# Patient Record
Sex: Female | Born: 1955 | Race: White | Hispanic: No | Marital: Married | Smoking: Former smoker
Health system: Southern US, Community
[De-identification: ages and names within clinical notes are randomized; demographics above are authoritative.]

## PROBLEM LIST (undated history)

## (undated) DIAGNOSIS — F329 Major depressive disorder, single episode, unspecified: Secondary | ICD-10-CM

## (undated) DIAGNOSIS — E785 Hyperlipidemia, unspecified: Secondary | ICD-10-CM

## (undated) DIAGNOSIS — F32A Depression, unspecified: Secondary | ICD-10-CM

## (undated) HISTORY — DX: Hyperlipidemia, unspecified: E78.5

## (undated) HISTORY — PX: REDUCTION MAMMAPLASTY: SUR839

---

## 2004-05-19 ENCOUNTER — Ambulatory Visit: Payer: Self-pay | Admitting: Psychiatry

## 2004-05-19 ENCOUNTER — Other Ambulatory Visit (HOSPITAL_COMMUNITY): Admission: RE | Admit: 2004-05-19 | Discharge: 2004-06-12 | Payer: Self-pay | Admitting: Psychiatry

## 2012-06-02 ENCOUNTER — Emergency Department (HOSPITAL_BASED_OUTPATIENT_CLINIC_OR_DEPARTMENT_OTHER)
Admission: EM | Admit: 2012-06-02 | Discharge: 2012-06-02 | Disposition: A | Discharge status: Home / Self Care | Payer: 59 | Attending: Emergency Medicine | Admitting: Emergency Medicine

## 2012-06-02 ENCOUNTER — Encounter (HOSPITAL_BASED_OUTPATIENT_CLINIC_OR_DEPARTMENT_OTHER): Payer: Self-pay

## 2012-06-02 DIAGNOSIS — F17200 Nicotine dependence, unspecified, uncomplicated: Secondary | ICD-10-CM | POA: Insufficient documentation

## 2012-06-02 DIAGNOSIS — R51 Headache: Secondary | ICD-10-CM | POA: Insufficient documentation

## 2012-06-02 DIAGNOSIS — F329 Major depressive disorder, single episode, unspecified: Secondary | ICD-10-CM | POA: Insufficient documentation

## 2012-06-02 DIAGNOSIS — Z79899 Other long term (current) drug therapy: Secondary | ICD-10-CM | POA: Insufficient documentation

## 2012-06-02 DIAGNOSIS — F3289 Other specified depressive episodes: Secondary | ICD-10-CM | POA: Insufficient documentation

## 2012-06-02 HISTORY — DX: Major depressive disorder, single episode, unspecified: F32.9

## 2012-06-02 HISTORY — DX: Depression, unspecified: F32.A

## 2012-06-02 MED ORDER — HYDROCODONE-ACETAMINOPHEN 5-325 MG PO TABS: 2.0000 | ORAL_TABLET | ORAL | Status: DC | PRN

## 2012-06-02 NOTE — ED Notes (Signed)
Pt reports left facial pain from temporal area radiating to left jaw.  Denies dental pain.

## 2012-06-02 NOTE — ED Provider Notes (Signed)
History     CSN: [id number]  Arrival date & time 06/02/12  0803   First MD Initiated Contact with Patient 06/02/12 0818      Chief Complaint  Patient presents with  . Facial Pain    (Consider location/radiation/quality/duration/timing/severity/associated sxs/prior treatment) HPI Comments: Patient presents with severe pain in the left side of her face and jaw that has been occurring off and on since October.  She states that this is a constant pain, not a shooting or intermittent pain.  Started again yesterday and not relieved with tylenol or ibuprofen.  No injury or trauma. Denies toothache or fever.  She was seen be dentistry for the same months ago and was told everything was okay.  Not worse with eating or drinking.  The history is provided by the patient.    Past Medical History  Diagnosis Date  . Depression     Past Surgical History  Procedure Laterality Date  . Cesarean section      No family history on file.  History  Substance Use Topics  . Smoking status: Current Every Day Smoker -- 0.50 packs/day    Types: Cigarettes  . Smokeless tobacco: Not on file  . Alcohol Use: Yes     Comment: weekend    OB History   Grav Para Term Preterm Abortions TAB SAB Ect Mult Living                  Review of Systems  All other systems reviewed and are negative.    Allergies  Review of patient's allergies indicates no known allergies.  Home Medications   Current Outpatient Rx  Name  Route  Sig  Dispense  Refill  . escitalopram (LEXAPRO) 10 MG tablet   Oral   Take 10 mg by mouth daily.         Marland Kitchen lamoTRIgine (LAMICTAL) 200 MG tablet   Oral   Take 200 mg by mouth daily.           BP 155/89  Pulse 80  Temp(Src) 97.5 F (36.4 C) (Oral)  Resp 16  SpO2 100%  Physical Exam  Nursing note and vitals reviewed. Constitutional: She is oriented to person, place, and time. She appears well-developed and well-nourished. No distress.  Patient appears anxious,  and is behaving quite dramatically.  HENT:  Head: Normocephalic and atraumatic.  Mouth/Throat: Oropharynx is clear and moist.  Dentition appears well.  No obvious caries or swelling. There is no ttp over the mandible, TMJ or maxillary region.  There is no anterior cervical adenopathy.  There is no crepitus of the TMJ.    Neck: Normal range of motion. Neck supple.  Cardiovascular: Normal rate and regular rhythm.   Pulmonary/Chest: Effort normal.  Lymphadenopathy:    She has no cervical adenopathy.  Neurological: She is alert and oriented to person, place, and time.  Skin: Skin is warm and dry. She is not diaphoretic.    ED Course  Procedures (including critical care time)  Labs Reviewed - No data to display No results found.   No diagnosis found.    MDM  Not sure of the exact etiology of her symptoms.  Does not appear dental, does not sound like trigeminal neuralgia.  I do not see anything emergent and do no believe further testing is warranted at this time.  Will treat with a few vicodin, needs follow up with pcp.        Geoffery Lyons, MD 06/02/12 [phone]

## 2012-06-02 NOTE — ED Notes (Signed)
Pt reports left facial pain that started last night unrelieved after taking Tylenol and Ibuprofen.

## 2020-05-31 DIAGNOSIS — R69 Illness, unspecified: Secondary | ICD-10-CM | POA: Diagnosis not present

## 2020-05-31 DIAGNOSIS — F902 Attention-deficit hyperactivity disorder, combined type: Secondary | ICD-10-CM | POA: Diagnosis not present

## 2020-07-15 DIAGNOSIS — F909 Attention-deficit hyperactivity disorder, unspecified type: Secondary | ICD-10-CM | POA: Diagnosis not present

## 2020-07-15 DIAGNOSIS — R69 Illness, unspecified: Secondary | ICD-10-CM | POA: Diagnosis not present

## 2020-07-15 DIAGNOSIS — F419 Anxiety disorder, unspecified: Secondary | ICD-10-CM | POA: Diagnosis not present

## 2020-07-22 DIAGNOSIS — F419 Anxiety disorder, unspecified: Secondary | ICD-10-CM | POA: Diagnosis not present

## 2020-07-22 DIAGNOSIS — R69 Illness, unspecified: Secondary | ICD-10-CM | POA: Diagnosis not present

## 2020-07-22 DIAGNOSIS — F909 Attention-deficit hyperactivity disorder, unspecified type: Secondary | ICD-10-CM | POA: Diagnosis not present

## 2020-07-29 DIAGNOSIS — F909 Attention-deficit hyperactivity disorder, unspecified type: Secondary | ICD-10-CM | POA: Diagnosis not present

## 2020-07-29 DIAGNOSIS — F419 Anxiety disorder, unspecified: Secondary | ICD-10-CM | POA: Diagnosis not present

## 2020-07-29 DIAGNOSIS — R69 Illness, unspecified: Secondary | ICD-10-CM | POA: Diagnosis not present

## 2020-08-05 DIAGNOSIS — F419 Anxiety disorder, unspecified: Secondary | ICD-10-CM | POA: Diagnosis not present

## 2020-08-05 DIAGNOSIS — F909 Attention-deficit hyperactivity disorder, unspecified type: Secondary | ICD-10-CM | POA: Diagnosis not present

## 2020-08-05 DIAGNOSIS — R69 Illness, unspecified: Secondary | ICD-10-CM | POA: Diagnosis not present

## 2020-08-12 DIAGNOSIS — R69 Illness, unspecified: Secondary | ICD-10-CM | POA: Diagnosis not present

## 2020-08-12 DIAGNOSIS — F419 Anxiety disorder, unspecified: Secondary | ICD-10-CM | POA: Diagnosis not present

## 2020-08-12 DIAGNOSIS — F909 Attention-deficit hyperactivity disorder, unspecified type: Secondary | ICD-10-CM | POA: Diagnosis not present

## 2020-08-19 DIAGNOSIS — R69 Illness, unspecified: Secondary | ICD-10-CM | POA: Diagnosis not present

## 2020-08-19 DIAGNOSIS — F909 Attention-deficit hyperactivity disorder, unspecified type: Secondary | ICD-10-CM | POA: Diagnosis not present

## 2020-08-19 DIAGNOSIS — F419 Anxiety disorder, unspecified: Secondary | ICD-10-CM | POA: Diagnosis not present

## 2020-09-04 DIAGNOSIS — F909 Attention-deficit hyperactivity disorder, unspecified type: Secondary | ICD-10-CM | POA: Diagnosis not present

## 2020-09-04 DIAGNOSIS — F419 Anxiety disorder, unspecified: Secondary | ICD-10-CM | POA: Diagnosis not present

## 2020-09-04 DIAGNOSIS — R69 Illness, unspecified: Secondary | ICD-10-CM | POA: Diagnosis not present

## 2020-09-18 DIAGNOSIS — F909 Attention-deficit hyperactivity disorder, unspecified type: Secondary | ICD-10-CM | POA: Diagnosis not present

## 2020-09-18 DIAGNOSIS — R69 Illness, unspecified: Secondary | ICD-10-CM | POA: Diagnosis not present

## 2020-09-18 DIAGNOSIS — F419 Anxiety disorder, unspecified: Secondary | ICD-10-CM | POA: Diagnosis not present

## 2020-10-10 DIAGNOSIS — F909 Attention-deficit hyperactivity disorder, unspecified type: Secondary | ICD-10-CM | POA: Diagnosis not present

## 2020-10-10 DIAGNOSIS — R69 Illness, unspecified: Secondary | ICD-10-CM | POA: Diagnosis not present

## 2020-10-10 DIAGNOSIS — F419 Anxiety disorder, unspecified: Secondary | ICD-10-CM | POA: Diagnosis not present

## 2020-10-29 DIAGNOSIS — R69 Illness, unspecified: Secondary | ICD-10-CM | POA: Diagnosis not present

## 2020-10-29 DIAGNOSIS — F419 Anxiety disorder, unspecified: Secondary | ICD-10-CM | POA: Diagnosis not present

## 2020-10-29 DIAGNOSIS — F909 Attention-deficit hyperactivity disorder, unspecified type: Secondary | ICD-10-CM | POA: Diagnosis not present

## 2020-11-12 DIAGNOSIS — F419 Anxiety disorder, unspecified: Secondary | ICD-10-CM | POA: Diagnosis not present

## 2020-11-12 DIAGNOSIS — F909 Attention-deficit hyperactivity disorder, unspecified type: Secondary | ICD-10-CM | POA: Diagnosis not present

## 2020-11-12 DIAGNOSIS — R69 Illness, unspecified: Secondary | ICD-10-CM | POA: Diagnosis not present

## 2020-11-26 DIAGNOSIS — F339 Major depressive disorder, recurrent, unspecified: Secondary | ICD-10-CM | POA: Diagnosis not present

## 2020-11-26 DIAGNOSIS — F419 Anxiety disorder, unspecified: Secondary | ICD-10-CM | POA: Diagnosis not present

## 2020-11-26 DIAGNOSIS — F909 Attention-deficit hyperactivity disorder, unspecified type: Secondary | ICD-10-CM | POA: Diagnosis not present

## 2020-11-26 DIAGNOSIS — R69 Illness, unspecified: Secondary | ICD-10-CM | POA: Diagnosis not present

## 2020-11-27 DIAGNOSIS — F3181 Bipolar II disorder: Secondary | ICD-10-CM | POA: Diagnosis not present

## 2020-11-27 DIAGNOSIS — F902 Attention-deficit hyperactivity disorder, combined type: Secondary | ICD-10-CM | POA: Diagnosis not present

## 2020-11-27 DIAGNOSIS — R69 Illness, unspecified: Secondary | ICD-10-CM | POA: Diagnosis not present

## 2020-12-10 DIAGNOSIS — F909 Attention-deficit hyperactivity disorder, unspecified type: Secondary | ICD-10-CM | POA: Diagnosis not present

## 2020-12-10 DIAGNOSIS — F419 Anxiety disorder, unspecified: Secondary | ICD-10-CM | POA: Diagnosis not present

## 2020-12-10 DIAGNOSIS — F339 Major depressive disorder, recurrent, unspecified: Secondary | ICD-10-CM | POA: Diagnosis not present

## 2020-12-10 DIAGNOSIS — R69 Illness, unspecified: Secondary | ICD-10-CM | POA: Diagnosis not present

## 2020-12-24 DIAGNOSIS — F419 Anxiety disorder, unspecified: Secondary | ICD-10-CM | POA: Diagnosis not present

## 2020-12-24 DIAGNOSIS — F339 Major depressive disorder, recurrent, unspecified: Secondary | ICD-10-CM | POA: Diagnosis not present

## 2020-12-24 DIAGNOSIS — R69 Illness, unspecified: Secondary | ICD-10-CM | POA: Diagnosis not present

## 2020-12-24 DIAGNOSIS — F909 Attention-deficit hyperactivity disorder, unspecified type: Secondary | ICD-10-CM | POA: Diagnosis not present

## 2021-01-07 DIAGNOSIS — R69 Illness, unspecified: Secondary | ICD-10-CM | POA: Diagnosis not present

## 2021-01-07 DIAGNOSIS — F909 Attention-deficit hyperactivity disorder, unspecified type: Secondary | ICD-10-CM | POA: Diagnosis not present

## 2021-01-07 DIAGNOSIS — F339 Major depressive disorder, recurrent, unspecified: Secondary | ICD-10-CM | POA: Diagnosis not present

## 2021-01-07 DIAGNOSIS — F419 Anxiety disorder, unspecified: Secondary | ICD-10-CM | POA: Diagnosis not present

## 2021-01-21 DIAGNOSIS — R030 Elevated blood-pressure reading, without diagnosis of hypertension: Secondary | ICD-10-CM | POA: Diagnosis not present

## 2021-01-21 DIAGNOSIS — Z1159 Encounter for screening for other viral diseases: Secondary | ICD-10-CM | POA: Diagnosis not present

## 2021-01-21 DIAGNOSIS — Z136 Encounter for screening for cardiovascular disorders: Secondary | ICD-10-CM | POA: Diagnosis not present

## 2021-01-21 DIAGNOSIS — E2839 Other primary ovarian failure: Secondary | ICD-10-CM | POA: Diagnosis not present

## 2021-01-21 DIAGNOSIS — Z23 Encounter for immunization: Secondary | ICD-10-CM | POA: Diagnosis not present

## 2021-01-21 DIAGNOSIS — Z0000 Encounter for general adult medical examination without abnormal findings: Secondary | ICD-10-CM | POA: Diagnosis not present

## 2021-01-21 DIAGNOSIS — R69 Illness, unspecified: Secondary | ICD-10-CM | POA: Diagnosis not present

## 2021-01-21 DIAGNOSIS — Z124 Encounter for screening for malignant neoplasm of cervix: Secondary | ICD-10-CM | POA: Diagnosis not present

## 2021-01-21 DIAGNOSIS — Z1211 Encounter for screening for malignant neoplasm of colon: Secondary | ICD-10-CM | POA: Diagnosis not present

## 2021-01-21 DIAGNOSIS — Z13220 Encounter for screening for lipoid disorders: Secondary | ICD-10-CM | POA: Diagnosis not present

## 2021-01-28 ENCOUNTER — Other Ambulatory Visit: Payer: Self-pay | Admitting: Family Medicine

## 2021-01-28 DIAGNOSIS — E2839 Other primary ovarian failure: Secondary | ICD-10-CM

## 2021-02-04 DIAGNOSIS — F909 Attention-deficit hyperactivity disorder, unspecified type: Secondary | ICD-10-CM | POA: Diagnosis not present

## 2021-02-04 DIAGNOSIS — F339 Major depressive disorder, recurrent, unspecified: Secondary | ICD-10-CM | POA: Diagnosis not present

## 2021-02-04 DIAGNOSIS — F419 Anxiety disorder, unspecified: Secondary | ICD-10-CM | POA: Diagnosis not present

## 2021-02-04 DIAGNOSIS — R69 Illness, unspecified: Secondary | ICD-10-CM | POA: Diagnosis not present

## 2021-02-11 DIAGNOSIS — F419 Anxiety disorder, unspecified: Secondary | ICD-10-CM | POA: Diagnosis not present

## 2021-02-11 DIAGNOSIS — R69 Illness, unspecified: Secondary | ICD-10-CM | POA: Diagnosis not present

## 2021-02-11 DIAGNOSIS — F909 Attention-deficit hyperactivity disorder, unspecified type: Secondary | ICD-10-CM | POA: Diagnosis not present

## 2021-02-11 DIAGNOSIS — F339 Major depressive disorder, recurrent, unspecified: Secondary | ICD-10-CM | POA: Diagnosis not present

## 2021-02-25 DIAGNOSIS — F339 Major depressive disorder, recurrent, unspecified: Secondary | ICD-10-CM | POA: Diagnosis not present

## 2021-02-25 DIAGNOSIS — R69 Illness, unspecified: Secondary | ICD-10-CM | POA: Diagnosis not present

## 2021-02-25 DIAGNOSIS — F909 Attention-deficit hyperactivity disorder, unspecified type: Secondary | ICD-10-CM | POA: Diagnosis not present

## 2021-02-25 DIAGNOSIS — F419 Anxiety disorder, unspecified: Secondary | ICD-10-CM | POA: Diagnosis not present

## 2021-02-26 ENCOUNTER — Other Ambulatory Visit: Payer: Self-pay | Admitting: Family Medicine

## 2021-02-26 DIAGNOSIS — Z1231 Encounter for screening mammogram for malignant neoplasm of breast: Secondary | ICD-10-CM

## 2021-03-11 DIAGNOSIS — R69 Illness, unspecified: Secondary | ICD-10-CM | POA: Diagnosis not present

## 2021-03-11 DIAGNOSIS — F909 Attention-deficit hyperactivity disorder, unspecified type: Secondary | ICD-10-CM | POA: Diagnosis not present

## 2021-03-11 DIAGNOSIS — F339 Major depressive disorder, recurrent, unspecified: Secondary | ICD-10-CM | POA: Diagnosis not present

## 2021-03-11 DIAGNOSIS — F419 Anxiety disorder, unspecified: Secondary | ICD-10-CM | POA: Diagnosis not present

## 2021-03-25 DIAGNOSIS — F339 Major depressive disorder, recurrent, unspecified: Secondary | ICD-10-CM | POA: Diagnosis not present

## 2021-03-25 DIAGNOSIS — F419 Anxiety disorder, unspecified: Secondary | ICD-10-CM | POA: Diagnosis not present

## 2021-03-25 DIAGNOSIS — R69 Illness, unspecified: Secondary | ICD-10-CM | POA: Diagnosis not present

## 2021-03-25 DIAGNOSIS — F909 Attention-deficit hyperactivity disorder, unspecified type: Secondary | ICD-10-CM | POA: Diagnosis not present

## 2021-04-15 DIAGNOSIS — F909 Attention-deficit hyperactivity disorder, unspecified type: Secondary | ICD-10-CM | POA: Diagnosis not present

## 2021-04-15 DIAGNOSIS — R69 Illness, unspecified: Secondary | ICD-10-CM | POA: Diagnosis not present

## 2021-04-15 DIAGNOSIS — F419 Anxiety disorder, unspecified: Secondary | ICD-10-CM | POA: Diagnosis not present

## 2021-04-15 DIAGNOSIS — F339 Major depressive disorder, recurrent, unspecified: Secondary | ICD-10-CM | POA: Diagnosis not present

## 2021-05-06 DIAGNOSIS — F419 Anxiety disorder, unspecified: Secondary | ICD-10-CM | POA: Diagnosis not present

## 2021-05-06 DIAGNOSIS — F339 Major depressive disorder, recurrent, unspecified: Secondary | ICD-10-CM | POA: Diagnosis not present

## 2021-05-06 DIAGNOSIS — R69 Illness, unspecified: Secondary | ICD-10-CM | POA: Diagnosis not present

## 2021-05-06 DIAGNOSIS — F909 Attention-deficit hyperactivity disorder, unspecified type: Secondary | ICD-10-CM | POA: Diagnosis not present

## 2021-05-09 ENCOUNTER — Encounter (HOSPITAL_BASED_OUTPATIENT_CLINIC_OR_DEPARTMENT_OTHER): Payer: Self-pay

## 2021-05-09 ENCOUNTER — Other Ambulatory Visit: Payer: Self-pay

## 2021-05-09 ENCOUNTER — Emergency Department (HOSPITAL_BASED_OUTPATIENT_CLINIC_OR_DEPARTMENT_OTHER)
Admission: EM | Admit: 2021-05-09 | Discharge: 2021-05-10 | Disposition: A | Discharge status: Home / Self Care | Payer: Medicare HMO | Attending: Emergency Medicine | Admitting: Emergency Medicine

## 2021-05-09 DIAGNOSIS — T6591XA Toxic effect of unspecified substance, accidental (unintentional), initial encounter: Secondary | ICD-10-CM | POA: Diagnosis present

## 2021-05-09 DIAGNOSIS — T304 Corrosion of unspecified body region, unspecified degree: Secondary | ICD-10-CM

## 2021-05-09 DIAGNOSIS — T2602XA Burn of left eyelid and periocular area, initial encounter: Secondary | ICD-10-CM | POA: Diagnosis not present

## 2021-05-09 DIAGNOSIS — Z79899 Other long term (current) drug therapy: Secondary | ICD-10-CM | POA: Diagnosis not present

## 2021-05-09 DIAGNOSIS — E7800 Pure hypercholesterolemia, unspecified: Secondary | ICD-10-CM | POA: Diagnosis not present

## 2021-05-09 DIAGNOSIS — X58XXXA Exposure to other specified factors, initial encounter: Secondary | ICD-10-CM | POA: Diagnosis not present

## 2021-05-09 DIAGNOSIS — S0502XA Injury of conjunctiva and corneal abrasion without foreign body, left eye, initial encounter: Secondary | ICD-10-CM | POA: Diagnosis not present

## 2021-05-09 MED ORDER — ERYTHROMYCIN 5 MG/GM OP OINT
TOPICAL_OINTMENT | Freq: Once | OPHTHALMIC | Status: AC
  Administered 2021-05-09: 1 via OPHTHALMIC
  Filled 2021-05-09: qty 3.5

## 2021-05-09 MED ORDER — OXYCODONE HCL 5 MG PO TABS: 5.0000 mg | ORAL_TABLET | Freq: Four times a day (QID) | ORAL | 0 refills | Status: DC | PRN

## 2021-05-09 MED ORDER — FLUORESCEIN SODIUM 1 MG OP STRP
1.0000 | ORAL_STRIP | Freq: Once | OPHTHALMIC | Status: AC
  Administered 2021-05-09: 1 via OPHTHALMIC
  Filled 2021-05-09: qty 1

## 2021-05-09 MED ORDER — FENTANYL CITRATE PF 50 MCG/ML IJ SOSY
100.0000 ug | PREFILLED_SYRINGE | Freq: Once | INTRAMUSCULAR | Status: AC
  Administered 2021-05-09: 100 ug via INTRAVENOUS
  Filled 2021-05-09: qty 2

## 2021-05-09 MED ORDER — HYDROMORPHONE HCL 1 MG/ML IJ SOLN
1.0000 mg | Freq: Once | INTRAMUSCULAR | Status: AC
  Administered 2021-05-09: 1 mg via INTRAVENOUS
  Filled 2021-05-09: qty 1

## 2021-05-09 MED ORDER — ERYTHROMYCIN 5 MG/GM OP OINT: TOPICAL_OINTMENT | OPHTHALMIC | 1 refills | Status: DC
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
## 2021-05-13 DIAGNOSIS — R69 Illness, unspecified: Secondary | ICD-10-CM | POA: Diagnosis not present

## 2021-05-13 DIAGNOSIS — F909 Attention-deficit hyperactivity disorder, unspecified type: Secondary | ICD-10-CM | POA: Diagnosis not present

## 2021-05-13 DIAGNOSIS — F339 Major depressive disorder, recurrent, unspecified: Secondary | ICD-10-CM | POA: Diagnosis not present

## 2021-05-13 DIAGNOSIS — F419 Anxiety disorder, unspecified: Secondary | ICD-10-CM | POA: Diagnosis not present

## 2021-05-14 DIAGNOSIS — H11432 Conjunctival hyperemia, left eye: Secondary | ICD-10-CM | POA: Diagnosis not present

## 2021-05-14 DIAGNOSIS — H04123 Dry eye syndrome of bilateral lacrimal glands: Secondary | ICD-10-CM | POA: Diagnosis not present

## 2021-05-14 DIAGNOSIS — H353131 Nonexudative age-related macular degeneration, bilateral, early dry stage: Secondary | ICD-10-CM | POA: Diagnosis not present

## 2021-05-14 DIAGNOSIS — H35363 Drusen (degenerative) of macula, bilateral: Secondary | ICD-10-CM | POA: Diagnosis not present

## 2021-05-14 DIAGNOSIS — S0512XA Contusion of eyeball and orbital tissues, left eye, initial encounter: Secondary | ICD-10-CM | POA: Diagnosis not present

## 2021-05-14 DIAGNOSIS — S0502XA Injury of conjunctiva and corneal abrasion without foreign body, left eye, initial encounter: Secondary | ICD-10-CM | POA: Diagnosis not present

## 2021-05-26 DIAGNOSIS — S0512XD Contusion of eyeball and orbital tissues, left eye, subsequent encounter: Secondary | ICD-10-CM | POA: Diagnosis not present

## 2021-05-26 DIAGNOSIS — H35363 Drusen (degenerative) of macula, bilateral: Secondary | ICD-10-CM | POA: Diagnosis not present

## 2021-05-26 DIAGNOSIS — H11432 Conjunctival hyperemia, left eye: Secondary | ICD-10-CM | POA: Diagnosis not present

## 2021-05-26 DIAGNOSIS — H353131 Nonexudative age-related macular degeneration, bilateral, early dry stage: Secondary | ICD-10-CM | POA: Diagnosis not present

## 2021-05-26 DIAGNOSIS — R69 Illness, unspecified: Secondary | ICD-10-CM | POA: Diagnosis not present

## 2021-05-26 DIAGNOSIS — F3181 Bipolar II disorder: Secondary | ICD-10-CM | POA: Diagnosis not present

## 2021-05-26 DIAGNOSIS — H04123 Dry eye syndrome of bilateral lacrimal glands: Secondary | ICD-10-CM | POA: Diagnosis not present

## 2021-05-26 DIAGNOSIS — F902 Attention-deficit hyperactivity disorder, combined type: Secondary | ICD-10-CM | POA: Diagnosis not present

## 2021-05-27 DIAGNOSIS — F339 Major depressive disorder, recurrent, unspecified: Secondary | ICD-10-CM | POA: Diagnosis not present

## 2021-05-27 DIAGNOSIS — F419 Anxiety disorder, unspecified: Secondary | ICD-10-CM | POA: Diagnosis not present

## 2021-05-27 DIAGNOSIS — F909 Attention-deficit hyperactivity disorder, unspecified type: Secondary | ICD-10-CM | POA: Diagnosis not present

## 2021-05-27 DIAGNOSIS — R69 Illness, unspecified: Secondary | ICD-10-CM | POA: Diagnosis not present

## 2021-05-30 DIAGNOSIS — Z85828 Personal history of other malignant neoplasm of skin: Secondary | ICD-10-CM | POA: Diagnosis not present

## 2021-05-30 DIAGNOSIS — D225 Melanocytic nevi of trunk: Secondary | ICD-10-CM | POA: Diagnosis not present

## 2021-05-30 DIAGNOSIS — Z08 Encounter for follow-up examination after completed treatment for malignant neoplasm: Secondary | ICD-10-CM | POA: Diagnosis not present

## 2021-06-03 DIAGNOSIS — F339 Major depressive disorder, recurrent, unspecified: Secondary | ICD-10-CM | POA: Diagnosis not present

## 2021-06-03 DIAGNOSIS — F419 Anxiety disorder, unspecified: Secondary | ICD-10-CM | POA: Diagnosis not present

## 2021-06-03 DIAGNOSIS — R69 Illness, unspecified: Secondary | ICD-10-CM | POA: Diagnosis not present

## 2021-06-03 DIAGNOSIS — F909 Attention-deficit hyperactivity disorder, unspecified type: Secondary | ICD-10-CM | POA: Diagnosis not present

## 2021-06-17 DIAGNOSIS — F909 Attention-deficit hyperactivity disorder, unspecified type: Secondary | ICD-10-CM | POA: Diagnosis not present

## 2021-06-17 DIAGNOSIS — F339 Major depressive disorder, recurrent, unspecified: Secondary | ICD-10-CM | POA: Diagnosis not present

## 2021-06-17 DIAGNOSIS — F419 Anxiety disorder, unspecified: Secondary | ICD-10-CM | POA: Diagnosis not present

## 2021-06-17 DIAGNOSIS — R69 Illness, unspecified: Secondary | ICD-10-CM | POA: Diagnosis not present

## 2021-07-01 DIAGNOSIS — F419 Anxiety disorder, unspecified: Secondary | ICD-10-CM | POA: Diagnosis not present

## 2021-07-01 DIAGNOSIS — R69 Illness, unspecified: Secondary | ICD-10-CM | POA: Diagnosis not present

## 2021-07-01 DIAGNOSIS — F909 Attention-deficit hyperactivity disorder, unspecified type: Secondary | ICD-10-CM | POA: Diagnosis not present

## 2021-07-01 DIAGNOSIS — F339 Major depressive disorder, recurrent, unspecified: Secondary | ICD-10-CM | POA: Diagnosis not present

## 2021-07-15 DIAGNOSIS — F419 Anxiety disorder, unspecified: Secondary | ICD-10-CM | POA: Diagnosis not present

## 2021-07-15 DIAGNOSIS — F339 Major depressive disorder, recurrent, unspecified: Secondary | ICD-10-CM | POA: Diagnosis not present

## 2021-07-15 DIAGNOSIS — F909 Attention-deficit hyperactivity disorder, unspecified type: Secondary | ICD-10-CM | POA: Diagnosis not present

## 2021-07-15 DIAGNOSIS — R69 Illness, unspecified: Secondary | ICD-10-CM | POA: Diagnosis not present

## 2021-07-23 ENCOUNTER — Ambulatory Visit
Admission: RE | Admit: 2021-07-23 | Discharge: 2021-07-23 | Disposition: A | Discharge status: Home / Self Care | Payer: Self-pay | Source: Ambulatory Visit | Attending: Family Medicine | Admitting: Family Medicine

## 2021-07-23 ENCOUNTER — Ambulatory Visit
Admission: RE | Admit: 2021-07-23 | Discharge: 2021-07-23 | Disposition: A | Discharge status: Home / Self Care | Payer: Medicare HMO | Source: Ambulatory Visit | Attending: Family Medicine | Admitting: Family Medicine

## 2021-07-23 DIAGNOSIS — M85851 Other specified disorders of bone density and structure, right thigh: Secondary | ICD-10-CM | POA: Diagnosis not present

## 2021-07-23 DIAGNOSIS — Z1231 Encounter for screening mammogram for malignant neoplasm of breast: Secondary | ICD-10-CM

## 2021-07-23 DIAGNOSIS — M810 Age-related osteoporosis without current pathological fracture: Secondary | ICD-10-CM | POA: Diagnosis not present

## 2021-07-23 DIAGNOSIS — E2839 Other primary ovarian failure: Secondary | ICD-10-CM

## 2021-07-23 DIAGNOSIS — Z780 Asymptomatic menopausal state: Secondary | ICD-10-CM | POA: Diagnosis not present

## 2021-07-24 ENCOUNTER — Other Ambulatory Visit: Payer: Self-pay | Admitting: Family Medicine

## 2021-07-24 DIAGNOSIS — R928 Other abnormal and inconclusive findings on diagnostic imaging of breast: Secondary | ICD-10-CM

## 2021-07-29 DIAGNOSIS — Z961 Presence of intraocular lens: Secondary | ICD-10-CM | POA: Diagnosis not present

## 2021-07-29 DIAGNOSIS — H5202 Hypermetropia, left eye: Secondary | ICD-10-CM | POA: Diagnosis not present

## 2021-07-29 DIAGNOSIS — H524 Presbyopia: Secondary | ICD-10-CM | POA: Diagnosis not present

## 2021-07-29 DIAGNOSIS — H26493 Other secondary cataract, bilateral: Secondary | ICD-10-CM | POA: Diagnosis not present

## 2021-07-29 DIAGNOSIS — H52203 Unspecified astigmatism, bilateral: Secondary | ICD-10-CM | POA: Diagnosis not present

## 2021-08-06 ENCOUNTER — Ambulatory Visit
Admission: RE | Admit: 2021-08-06 | Discharge: 2021-08-06 | Disposition: A | Discharge status: Home / Self Care | Payer: Medicare HMO | Source: Ambulatory Visit | Attending: Family Medicine | Admitting: Family Medicine

## 2021-08-06 ENCOUNTER — Other Ambulatory Visit: Payer: Self-pay | Admitting: Family Medicine

## 2021-08-06 DIAGNOSIS — N6314 Unspecified lump in the right breast, lower inner quadrant: Secondary | ICD-10-CM | POA: Diagnosis not present

## 2021-08-06 DIAGNOSIS — N6310 Unspecified lump in the right breast, unspecified quadrant: Secondary | ICD-10-CM

## 2021-08-06 DIAGNOSIS — N6313 Unspecified lump in the right breast, lower outer quadrant: Secondary | ICD-10-CM | POA: Diagnosis not present

## 2021-08-06 DIAGNOSIS — R928 Other abnormal and inconclusive findings on diagnostic imaging of breast: Secondary | ICD-10-CM

## 2021-08-13 DIAGNOSIS — M810 Age-related osteoporosis without current pathological fracture: Secondary | ICD-10-CM | POA: Diagnosis not present

## 2021-08-20 DIAGNOSIS — R69 Illness, unspecified: Secondary | ICD-10-CM | POA: Diagnosis not present

## 2021-08-20 DIAGNOSIS — F419 Anxiety disorder, unspecified: Secondary | ICD-10-CM | POA: Diagnosis not present

## 2021-08-20 DIAGNOSIS — F339 Major depressive disorder, recurrent, unspecified: Secondary | ICD-10-CM | POA: Diagnosis not present

## 2021-08-20 DIAGNOSIS — F909 Attention-deficit hyperactivity disorder, unspecified type: Secondary | ICD-10-CM | POA: Diagnosis not present

## 2021-09-18 DIAGNOSIS — L0390 Cellulitis, unspecified: Secondary | ICD-10-CM | POA: Diagnosis not present

## 2021-09-18 DIAGNOSIS — W57XXXA Bitten or stung by nonvenomous insect and other nonvenomous arthropods, initial encounter: Secondary | ICD-10-CM | POA: Diagnosis not present

## 2021-10-13 DIAGNOSIS — S1086XD Insect bite of other specified part of neck, subsequent encounter: Secondary | ICD-10-CM | POA: Diagnosis not present

## 2021-10-13 DIAGNOSIS — W57XXXD Bitten or stung by nonvenomous insect and other nonvenomous arthropods, subsequent encounter: Secondary | ICD-10-CM | POA: Diagnosis not present

## 2021-10-23 DIAGNOSIS — M810 Age-related osteoporosis without current pathological fracture: Secondary | ICD-10-CM | POA: Diagnosis not present

## 2021-10-31 ENCOUNTER — Other Ambulatory Visit: Payer: Self-pay | Admitting: Family Medicine

## 2021-10-31 DIAGNOSIS — Z8249 Family history of ischemic heart disease and other diseases of the circulatory system: Secondary | ICD-10-CM

## 2021-10-31 DIAGNOSIS — E7800 Pure hypercholesterolemia, unspecified: Secondary | ICD-10-CM

## 2021-12-04 DIAGNOSIS — Z86010 Personal history of colonic polyps: Secondary | ICD-10-CM | POA: Diagnosis not present

## 2021-12-04 DIAGNOSIS — K635 Polyp of colon: Secondary | ICD-10-CM | POA: Diagnosis not present

## 2021-12-04 DIAGNOSIS — K5730 Diverticulosis of large intestine without perforation or abscess without bleeding: Secondary | ICD-10-CM | POA: Diagnosis not present

## 2021-12-04 DIAGNOSIS — Z09 Encounter for follow-up examination after completed treatment for conditions other than malignant neoplasm: Secondary | ICD-10-CM | POA: Diagnosis not present

## 2021-12-08 DIAGNOSIS — K635 Polyp of colon: Secondary | ICD-10-CM | POA: Diagnosis not present

## 2022-01-15 ENCOUNTER — Ambulatory Visit
Admission: RE | Admit: 2022-01-15 | Discharge: 2022-01-15 | Disposition: A | Discharge status: Home / Self Care | Payer: No Typology Code available for payment source | Source: Ambulatory Visit | Attending: Family Medicine | Admitting: Family Medicine

## 2022-01-15 DIAGNOSIS — Z8249 Family history of ischemic heart disease and other diseases of the circulatory system: Secondary | ICD-10-CM

## 2022-01-15 DIAGNOSIS — I700 Atherosclerosis of aorta: Secondary | ICD-10-CM | POA: Diagnosis not present

## 2022-01-15 DIAGNOSIS — E7800 Pure hypercholesterolemia, unspecified: Secondary | ICD-10-CM

## 2022-01-22 DIAGNOSIS — D225 Melanocytic nevi of trunk: Secondary | ICD-10-CM | POA: Diagnosis not present

## 2022-01-22 DIAGNOSIS — L821 Other seborrheic keratosis: Secondary | ICD-10-CM | POA: Diagnosis not present

## 2022-01-22 DIAGNOSIS — Z08 Encounter for follow-up examination after completed treatment for malignant neoplasm: Secondary | ICD-10-CM | POA: Diagnosis not present

## 2022-01-22 DIAGNOSIS — L814 Other melanin hyperpigmentation: Secondary | ICD-10-CM | POA: Diagnosis not present

## 2022-01-22 DIAGNOSIS — Z85828 Personal history of other malignant neoplasm of skin: Secondary | ICD-10-CM | POA: Diagnosis not present

## 2022-01-26 DIAGNOSIS — R69 Illness, unspecified: Secondary | ICD-10-CM | POA: Diagnosis not present

## 2022-01-26 DIAGNOSIS — Z79899 Other long term (current) drug therapy: Secondary | ICD-10-CM | POA: Diagnosis not present

## 2022-01-26 DIAGNOSIS — F3181 Bipolar II disorder: Secondary | ICD-10-CM | POA: Diagnosis not present

## 2022-01-26 DIAGNOSIS — F902 Attention-deficit hyperactivity disorder, combined type: Secondary | ICD-10-CM | POA: Diagnosis not present

## 2022-02-09 ENCOUNTER — Ambulatory Visit
Admission: RE | Admit: 2022-02-09 | Discharge: 2022-02-09 | Disposition: A | Discharge status: Home / Self Care | Payer: Medicare HMO | Source: Ambulatory Visit | Attending: Family Medicine | Admitting: Family Medicine

## 2022-02-09 ENCOUNTER — Other Ambulatory Visit: Payer: Self-pay | Admitting: Family Medicine

## 2022-02-09 DIAGNOSIS — N6310 Unspecified lump in the right breast, unspecified quadrant: Secondary | ICD-10-CM

## 2022-02-09 DIAGNOSIS — N6314 Unspecified lump in the right breast, lower inner quadrant: Secondary | ICD-10-CM | POA: Diagnosis not present

## 2022-02-09 DIAGNOSIS — N6315 Unspecified lump in the right breast, overlapping quadrants: Secondary | ICD-10-CM | POA: Diagnosis not present

## 2022-02-09 DIAGNOSIS — N6313 Unspecified lump in the right breast, lower outer quadrant: Secondary | ICD-10-CM | POA: Diagnosis not present

## 2022-02-09 DIAGNOSIS — R92321 Mammographic fibroglandular density, right breast: Secondary | ICD-10-CM | POA: Diagnosis not present

## 2022-02-16 ENCOUNTER — Ambulatory Visit: Payer: Medicare HMO | Admitting: Internal Medicine

## 2022-02-16 ENCOUNTER — Encounter: Payer: Self-pay | Admitting: Internal Medicine

## 2022-02-16 VITALS — BP 173/90 | HR 74 | Resp 14 | Ht 64.0 in | Wt 143.2 lb

## 2022-02-16 DIAGNOSIS — I10 Essential (primary) hypertension: Secondary | ICD-10-CM | POA: Insufficient documentation

## 2022-02-16 DIAGNOSIS — E782 Mixed hyperlipidemia: Secondary | ICD-10-CM | POA: Insufficient documentation

## 2022-02-16 DIAGNOSIS — Z8249 Family history of ischemic heart disease and other diseases of the circulatory system: Secondary | ICD-10-CM | POA: Diagnosis not present

## 2022-02-16 MED ORDER — LISINOPRIL 10 MG PO TABS: 10.0000 mg | ORAL_TABLET | Freq: Every day | ORAL | 3 refills | Status: DC

## 2022-02-16 MED ORDER — HYDROCHLOROTHIAZIDE 25 MG PO TABS: 25.0000 mg | ORAL_TABLET | Freq: Every day | ORAL | 3 refills | Status: DC

## 2022-02-16 NOTE — Progress Notes (Signed)
Primary Physician/Referring:  Melissa Huxley, PA  Patient ID: Melissa Carrillo, female    DOB: 05-07-1955, 66 y.o.   MRN: [id number]  Chief Complaint  Patient presents with   Hyperlipidemia   Hypertension   HPI:    Melissa Carrillo  is a 66 y.o. female with past medical history significant for hypertension and hyperlipidemia who is here to establish care with cardiology.  She was noted to have aortic atherosclerosis on imaging however she has not been worked up for this.  Patient has never had a stress test or an echocardiogram in the past.  She is agreeable to obtaining the studies.  Patient denies chest pain, shortness of breath, palpitations, diaphoresis, syncope.  She does have a blood pressure cuff at home and she would prefer to start taking 1 pill while checking her blood pressure and then adding the second.  Past Medical History:  Diagnosis Date   Depression    Hyperlipidemia    Past Surgical History:  Procedure Laterality Date   CESAREAN SECTION     REDUCTION MAMMAPLASTY     Family History  Problem Relation Age of Onset   Stroke Mother    Pancreatitis Mother    Stroke Father    Kidney failure Father    Syncope episode Sister    Heart attack Brother 67   Gout Brother    Seizures Brother    Pancreatic cancer Maternal Aunt    Heart attack Maternal Grandmother    Thyroid cancer Daughter     Social History   Tobacco Use   Smoking status: Former    Packs/day: 0.50    Types: Cigarettes    Quit date: 2016    Years since quitting: 7.9   Smokeless tobacco: Not on file  Substance Use Topics   Alcohol use: Yes    Alcohol/week: 5.0 standard drinks of alcohol    Types: 5 Shots of liquor per week    Comment: about 5 vodka drinks a week   Marital Status: Married  ROS  Review of Systems  Cardiovascular:  Positive for irregular heartbeat. Negative for chest pain, claudication, cyanosis, dyspnea on exertion, near-syncope, orthopnea and palpitations.   Objective  Blood  pressure (!) 173/90, pulse 74, resp. rate 14, height _0  (1.626 m), weight 143 lb 3.2 oz (65 kg), SpO2 96 %. Body mass index is 24.58 kg/m.     02/16/2022    1:34 PM 05/09/2021   11:35 PM 05/09/2021    7:37 PM  Vitals with BMI  Height _1     Weight 143 lbs 3 oz    BMI 46.65    Systolic 993 570 177  Diastolic 90 87 86  Pulse 74 82 88     Physical Exam Vitals reviewed.  HENT:     Head: Normocephalic and atraumatic.  Cardiovascular:     Rate and Rhythm: Normal rate and regular rhythm.     Pulses: Normal pulses.     Heart sounds: Normal heart sounds. No murmur heard. Pulmonary:     Effort: Pulmonary effort is normal.     Breath sounds: Normal breath sounds.  Abdominal:     General: Bowel sounds are normal.  Musculoskeletal:     Right lower leg: No edema.     Left lower leg: No edema.  Skin:    General: Skin is warm and dry.  Neurological:     Mental Status: She is alert.     Medications and allergies  Allergies  Allergen Reactions   Neomycin-Polymyxin-Gramicidin Rash    It is an over the counter item   Nickel Rash     Medication list after today's encounter   Current Outpatient Medications:    amphetamine-dextroamphetamine (ADDERALL XR) 30 MG 24 hr capsule, Take 30 mg by mouth daily., Disp: , Rfl:    amphetamine-dextroamphetamine (ADDERALL) 20 MG tablet, Take 20 mg by mouth daily as needed., Disp: , Rfl:    aspirin EC 81 MG tablet, Take 81 mg by mouth daily. Swallow whole., Disp: , Rfl:    atorvastatin (LIPITOR) 20 MG tablet, Take 20 mg by mouth daily., Disp: , Rfl:    Biotin 1 MG CAPS, Take by mouth., Disp: , Rfl:    clindamycin (CLEOCIN) 150 MG capsule, Take 150 mg by mouth 3 (three) times daily., Disp: , Rfl:    clonazePAM (KLONOPIN) 1 MG tablet, Take 1 mg by mouth 2 (two) times daily as needed., Disp: , Rfl:    denosumab (PROLIA) 60 MG/ML SOSY injection, Inject 60 mg into the skin every 6 (six) months., Disp: , Rfl:    escitalopram (LEXAPRO) 10 MG tablet,  Take 10 mg by mouth daily., Disp: , Rfl:    hydrochlorothiazide (HYDRODIURIL) 25 MG tablet, Take 1 tablet (25 mg total) by mouth daily., Disp: 90 tablet, Rfl: 3   HYDROcodone-acetaminophen (NORCO) 5-325 MG per tablet, Take 2 tablets by mouth every 4 (four) hours as needed for pain., Disp: 10 tablet, Rfl: 0   lamoTRIgine (LAMICTAL) 200 MG tablet, Take 200 mg by mouth daily., Disp: , Rfl:    lisinopril (ZESTRIL) 10 MG tablet, Take 1 tablet (10 mg total) by mouth daily., Disp: 90 tablet, Rfl: 3  Laboratory examination:   No results found for: "NA", "K", "CO2", "GLUCOSE", "BUN", "CREATININE", "CALCIUM", "EGFR", "GFRNONAA"      No data to display             No data to display          Lipid Panel No results for input(s): "CHOL", "TRIG", "LDLCALC", "VLDL", "HDL", "CHOLHDL", "LDLDIRECT" in the last 8760 hours.  HEMOGLOBIN A1C No results found for: "HGBA1C", "MPG" TSH No results for input(s): "TSH" in the last 8760 hours.  External labs:     Radiology:    Cardiac Studies:     EKG:   02/16/2022: Sinus Rhythm with LVH, Nonspecific ST depression secondary to LVH. IVCD   Assessment     ICD-10-CM   1. Family history of heart attack  Z82.49 EKG 12-Lead    PCV ECHOCARDIOGRAM COMPLETE    PCV MYOCARDIAL PERFUSION WO LEXISCAN    2. Essential hypertension  I10 PCV ECHOCARDIOGRAM COMPLETE    PCV MYOCARDIAL PERFUSION WO LEXISCAN    3. Mixed hyperlipidemia  E78.2 PCV ECHOCARDIOGRAM COMPLETE    PCV MYOCARDIAL PERFUSION WO LEXISCAN       Orders Placed This Encounter  Procedures   PCV MYOCARDIAL PERFUSION WO LEXISCAN    Standing Status:   Future    Standing Expiration Date:   04/18/2022   EKG 12-Lead   PCV ECHOCARDIOGRAM COMPLETE    Standing Status:   Future    Standing Expiration Date:   02/17/2023    Meds ordered this encounter  Medications   hydrochlorothiazide (HYDRODIURIL) 25 MG tablet    Sig: Take 1 tablet (25 mg total) by mouth daily.    Dispense:  90 tablet     Refill:  3   lisinopril (ZESTRIL) 10 MG tablet  Sig: Take 1 tablet (10 mg total) by mouth daily.    Dispense:  90 tablet    Refill:  3    Medications Discontinued During This Encounter  Medication Reason   erythromycin ophthalmic ointment    oxyCODONE (ROXICODONE) 5 MG immediate release tablet      Recommendations:   Melissa Carrillo is a 66 y.o.  female with HTN and HLD  Family history of heart attack Echocardiogram and stress test ordered   Essential hypertension Continue current cardiac medications. Adding HCTZ and Lisinopril for better BP control Encourage low-sodium diet, less than 2000 mg daily. Follow-up in 1-2 months or sooner if needed   Mixed hyperlipidemia Continue statin     Floydene Flock, DO, [facility]  02/16/2022, 2:21 PM Office: [phone] Pager: [phone]

## 2022-03-02 ENCOUNTER — Ambulatory Visit: Payer: Medicare HMO

## 2022-03-02 DIAGNOSIS — I10 Essential (primary) hypertension: Secondary | ICD-10-CM

## 2022-03-02 DIAGNOSIS — Z8249 Family history of ischemic heart disease and other diseases of the circulatory system: Secondary | ICD-10-CM | POA: Diagnosis not present

## 2022-03-02 DIAGNOSIS — E782 Mixed hyperlipidemia: Secondary | ICD-10-CM

## 2022-03-30 ENCOUNTER — Ambulatory Visit: Payer: Medicare HMO

## 2022-03-30 DIAGNOSIS — Z0189 Encounter for other specified special examinations: Secondary | ICD-10-CM | POA: Diagnosis not present

## 2022-03-30 DIAGNOSIS — E782 Mixed hyperlipidemia: Secondary | ICD-10-CM

## 2022-03-30 DIAGNOSIS — Z8249 Family history of ischemic heart disease and other diseases of the circulatory system: Secondary | ICD-10-CM

## 2022-03-30 DIAGNOSIS — I10 Essential (primary) hypertension: Secondary | ICD-10-CM

## 2022-03-30 DIAGNOSIS — R9431 Abnormal electrocardiogram [ECG] [EKG]: Secondary | ICD-10-CM | POA: Diagnosis not present

## 2022-03-30 NOTE — Progress Notes (Signed)
Called patient to inform her about her stress test result. Patient understands

## 2022-04-03 ENCOUNTER — Ambulatory Visit: Payer: Medicare HMO | Admitting: Internal Medicine

## 2022-04-03 ENCOUNTER — Encounter: Payer: Self-pay | Admitting: Internal Medicine

## 2022-04-03 VITALS — BP 123/75 | HR 88 | Ht 64.0 in | Wt 146.6 lb

## 2022-04-03 DIAGNOSIS — I10 Essential (primary) hypertension: Secondary | ICD-10-CM | POA: Diagnosis not present

## 2022-04-03 DIAGNOSIS — E782 Mixed hyperlipidemia: Secondary | ICD-10-CM

## 2022-04-03 NOTE — Progress Notes (Signed)
Primary Physician/Referring:  [name], PA  Patient ID: Melissa Carrillo, female    DOB: 06-06-1955, 67 y.o.   MRN: [id number]  Chief Complaint  Patient presents with   Family history of heart attack   Follow-up   Results   HPI:    Melissa Carrillo  is a 67 y.o. female with past medical history significant for hypertension and hyperlipidemia who is here for a follow-up visit. Patient is quite happy that her echo and stress test were both normal. Her blood pressure is now very well controlled and patient is not having any side effects. She has been feeling well since our last visit.  Patient denies chest pain, shortness of breath, palpitations, diaphoresis, syncope.    Past Medical History:  Diagnosis Date   Depression    Hyperlipidemia    Past Surgical History:  Procedure Laterality Date   CESAREAN SECTION     REDUCTION MAMMAPLASTY     Family History  Problem Relation Age of Onset   Stroke Mother    Pancreatitis Mother    Stroke Father    Kidney failure Father    Syncope episode Sister    Heart attack Brother 64   Gout Brother    Seizures Brother    Pancreatic cancer Maternal Aunt    Heart attack Maternal Grandmother    Thyroid cancer Daughter     Social History   Tobacco Use   Smoking status: Former    Packs/day: 0.50    Types: Cigarettes    Quit date: 2016    Years since quitting: 8.0   Smokeless tobacco: Not on file  Substance Use Topics   Alcohol use: Yes    Alcohol/week: 5.0 standard drinks of alcohol    Types: 5 Shots of liquor per week    Comment: about 5 vodka drinks a week   Marital Status: Married  ROS  Review of Systems  Cardiovascular:  Negative for chest pain, claudication, cyanosis, dyspnea on exertion, near-syncope, orthopnea and palpitations.   Objective  Blood pressure 123/75, pulse 88, height '5\' 4"'$  (1.626 m), weight 146 lb 9.6 oz (66.5 kg), SpO2 98 %. Body mass index is 25.16 kg/m.     04/03/2022   11:38 AM 02/16/2022    1:34 PM  05/09/2021   11:35 PM  Vitals with BMI  Height '5\' 4"'$  '5\' 4"'$    Weight 146 lbs 10 oz 143 lbs 3 oz   BMI 76.72 09.47   Systolic 096 283 662  Diastolic 75 90 87  Pulse 88 74 82     Physical Exam Vitals reviewed.  HENT:     Head: Normocephalic and atraumatic.  Cardiovascular:     Rate and Rhythm: Normal rate and regular rhythm.     Pulses: Normal pulses.     Heart sounds: Normal heart sounds. No murmur heard. Pulmonary:     Effort: Pulmonary effort is normal.     Breath sounds: Normal breath sounds.  Abdominal:     General: Bowel sounds are normal.  Musculoskeletal:     Right lower leg: No edema.     Left lower leg: No edema.  Skin:    General: Skin is warm and dry.  Neurological:     Mental Status: She is alert.     Medications and allergies   Allergies  Allergen Reactions   Neomycin-Polymyxin-Gramicidin Rash    It is an over the counter item   Nickel Rash     Medication list after  today's encounter   Current Outpatient Medications:    amphetamine-dextroamphetamine (ADDERALL XR) 30 MG 24 hr capsule, Take 30 mg by mouth daily., Disp: , Rfl:    amphetamine-dextroamphetamine (ADDERALL) 20 MG tablet, Take 20 mg by mouth daily as needed., Disp: , Rfl:    aspirin EC 81 MG tablet, Take 81 mg by mouth daily. Swallow whole., Disp: , Rfl:    atorvastatin (LIPITOR) 20 MG tablet, Take 20 mg by mouth daily., Disp: , Rfl:    Biotin 1 MG CAPS, Take by mouth., Disp: , Rfl:    Cholecalciferol (D-3-5) 125 MCG (5000 UT) capsule, Take 5,000 Units by mouth daily., Disp: , Rfl:    clonazePAM (KLONOPIN) 1 MG tablet, Take 1 mg by mouth 2 (two) times daily as needed., Disp: , Rfl:    denosumab (PROLIA) 60 MG/ML SOSY injection, Inject 60 mg into the skin every 6 (six) months., Disp: , Rfl:    escitalopram (LEXAPRO) 10 MG tablet, Take 10 mg by mouth daily., Disp: , Rfl:    hydrochlorothiazide (HYDRODIURIL) 25 MG tablet, Take 1 tablet (25 mg total) by mouth daily., Disp: 90 tablet, Rfl: 3    HYDROcodone-acetaminophen (NORCO) 10-325 MG tablet, Take 1 tablet by mouth 3 (three) times daily as needed., Disp: , Rfl:    lamoTRIgine (LAMICTAL) 200 MG tablet, Take 200 mg by mouth daily., Disp: , Rfl:    lisinopril (ZESTRIL) 10 MG tablet, Take 1 tablet (10 mg total) by mouth daily., Disp: 90 tablet, Rfl: 3   multivitamin-lutein (OCUVITE-LUTEIN) CAPS capsule, Take 1 capsule by mouth daily., Disp: , Rfl:   Laboratory examination:   No results found for: "NA", "K", "CO2", "GLUCOSE", "BUN", "CREATININE", "CALCIUM", "EGFR", "GFRNONAA"      No data to display             No data to display          Lipid Panel No results for input(s): "CHOL", "TRIG", "LDLCALC", "VLDL", "HDL", "CHOLHDL", "LDLDIRECT" in the last 8760 hours.  HEMOGLOBIN A1C No results found for: "HGBA1C", "MPG" TSH No results for input(s): "TSH" in the last 8760 hours.  External labs:     Radiology:    Cardiac Studies:   Exercise nuclear stress test 03/30/2022 Myocardial perfusion is normal. Overall LV systolic function is normal without regional wall motion abnormalities. Stress LV EF: 60%. Low risk study. Normal ECG stress. The patient exercised for 5 minutes and 16 seconds of a Bruce protocol, achieving approximately 7.05 METs and reached 91% MPHR. The heart rate response was normal. The blood pressure response was normal. No previous exam available for comparison.   Echocardiogram 03/02/2022: Left ventricle cavity is normal in size and wall thickness. Normal global wall motion. Normal LV systolic function with EF 65%. Doppler evidence of grade I (impaired) diastolic dysfunction, normal LAP. No significant valvular abnormality. Normal right atrial pressure.    EKG:   02/16/2022: Sinus Rhythm with LVH, Nonspecific ST depression secondary to LVH. IVCD   Assessment     ICD-10-CM   1. Mixed hyperlipidemia  E78.2     2. Essential hypertension  I10        No orders of the defined types were  placed in this encounter.   No orders of the defined types were placed in this encounter.   Medications Discontinued During This Encounter  Medication Reason   clindamycin (CLEOCIN) 150 MG capsule Completed Course   HYDROcodone-acetaminophen ([place]) 5-325 MG per tablet Completed Course     Recommendations:  NATESHA HASSEY is a 67 y.o.  female with HTN and HLD  Family history of heart attack Echocardiogram and stress test negative for ischemia   Essential hypertension Continue current cardiac medications. BP very well controlled on lisinopril. She checks her BP regularly and takes the HCTZ as needed for elevated BP but she has not had to take it at all since starting on lisinopril. Encourage low-sodium diet, less than 2000 mg daily. Follow-up in 6 months or sooner if needed   Mixed hyperlipidemia Continue statin Primary following lipids     Melissa Flock, DO, [facility]  04/03/2022, 12:26 PM Office: [phone] Pager: [phone]

## 2022-04-14 DIAGNOSIS — M810 Age-related osteoporosis without current pathological fracture: Secondary | ICD-10-CM | POA: Diagnosis not present

## 2022-04-14 DIAGNOSIS — E7800 Pure hypercholesterolemia, unspecified: Secondary | ICD-10-CM | POA: Diagnosis not present

## 2022-04-14 DIAGNOSIS — E559 Vitamin D deficiency, unspecified: Secondary | ICD-10-CM | POA: Diagnosis not present

## 2022-04-14 DIAGNOSIS — Z0000 Encounter for general adult medical examination without abnormal findings: Secondary | ICD-10-CM | POA: Diagnosis not present

## 2022-04-14 DIAGNOSIS — I700 Atherosclerosis of aorta: Secondary | ICD-10-CM | POA: Diagnosis not present

## 2022-04-14 DIAGNOSIS — I10 Essential (primary) hypertension: Secondary | ICD-10-CM | POA: Diagnosis not present

## 2022-04-14 DIAGNOSIS — R69 Illness, unspecified: Secondary | ICD-10-CM | POA: Diagnosis not present

## 2022-05-07 DIAGNOSIS — M810 Age-related osteoporosis without current pathological fracture: Secondary | ICD-10-CM | POA: Diagnosis not present

## 2022-05-19 ENCOUNTER — Other Ambulatory Visit: Payer: Self-pay

## 2022-05-19 MED ORDER — LISINOPRIL 10 MG PO TABS: 10.0000 mg | ORAL_TABLET | Freq: Every day | ORAL | 3 refills | Status: AC

## 2022-06-06 DIAGNOSIS — R69 Illness, unspecified: Secondary | ICD-10-CM | POA: Diagnosis not present

## 2022-07-27 DIAGNOSIS — F902 Attention-deficit hyperactivity disorder, combined type: Secondary | ICD-10-CM | POA: Diagnosis not present

## 2022-07-27 DIAGNOSIS — F3181 Bipolar II disorder: Secondary | ICD-10-CM | POA: Diagnosis not present

## 2022-07-29 DIAGNOSIS — F909 Attention-deficit hyperactivity disorder, unspecified type: Secondary | ICD-10-CM | POA: Diagnosis not present

## 2022-07-29 DIAGNOSIS — F339 Major depressive disorder, recurrent, unspecified: Secondary | ICD-10-CM | POA: Diagnosis not present

## 2022-07-29 DIAGNOSIS — F419 Anxiety disorder, unspecified: Secondary | ICD-10-CM | POA: Diagnosis not present

## 2022-08-03 DIAGNOSIS — F419 Anxiety disorder, unspecified: Secondary | ICD-10-CM | POA: Diagnosis not present

## 2022-08-03 DIAGNOSIS — F339 Major depressive disorder, recurrent, unspecified: Secondary | ICD-10-CM | POA: Diagnosis not present

## 2022-08-03 DIAGNOSIS — F909 Attention-deficit hyperactivity disorder, unspecified type: Secondary | ICD-10-CM | POA: Diagnosis not present

## 2022-08-05 ENCOUNTER — Ambulatory Visit
Admission: RE | Admit: 2022-08-05 | Discharge: 2022-08-05 | Disposition: A | Discharge status: Home / Self Care | Payer: Medicare HMO | Source: Ambulatory Visit | Attending: Family Medicine | Admitting: Family Medicine

## 2022-08-05 DIAGNOSIS — N6310 Unspecified lump in the right breast, unspecified quadrant: Secondary | ICD-10-CM

## 2022-08-05 DIAGNOSIS — N63 Unspecified lump in unspecified breast: Secondary | ICD-10-CM | POA: Diagnosis not present

## 2022-08-11 DIAGNOSIS — F419 Anxiety disorder, unspecified: Secondary | ICD-10-CM | POA: Diagnosis not present

## 2022-08-11 DIAGNOSIS — F909 Attention-deficit hyperactivity disorder, unspecified type: Secondary | ICD-10-CM | POA: Diagnosis not present

## 2022-08-11 DIAGNOSIS — F339 Major depressive disorder, recurrent, unspecified: Secondary | ICD-10-CM | POA: Diagnosis not present

## 2022-08-25 DIAGNOSIS — F419 Anxiety disorder, unspecified: Secondary | ICD-10-CM | POA: Diagnosis not present

## 2022-08-25 DIAGNOSIS — F909 Attention-deficit hyperactivity disorder, unspecified type: Secondary | ICD-10-CM | POA: Diagnosis not present

## 2022-08-25 DIAGNOSIS — F339 Major depressive disorder, recurrent, unspecified: Secondary | ICD-10-CM | POA: Diagnosis not present

## 2022-09-04 DIAGNOSIS — R69 Illness, unspecified: Secondary | ICD-10-CM | POA: Diagnosis not present

## 2022-09-08 DIAGNOSIS — H26493 Other secondary cataract, bilateral: Secondary | ICD-10-CM | POA: Diagnosis not present

## 2022-09-08 DIAGNOSIS — F339 Major depressive disorder, recurrent, unspecified: Secondary | ICD-10-CM | POA: Diagnosis not present

## 2022-09-08 DIAGNOSIS — F419 Anxiety disorder, unspecified: Secondary | ICD-10-CM | POA: Diagnosis not present

## 2022-09-08 DIAGNOSIS — F909 Attention-deficit hyperactivity disorder, unspecified type: Secondary | ICD-10-CM | POA: Diagnosis not present

## 2022-09-08 DIAGNOSIS — H524 Presbyopia: Secondary | ICD-10-CM | POA: Diagnosis not present

## 2022-09-08 DIAGNOSIS — Z961 Presence of intraocular lens: Secondary | ICD-10-CM | POA: Diagnosis not present

## 2022-09-08 DIAGNOSIS — H18413 Arcus senilis, bilateral: Secondary | ICD-10-CM | POA: Diagnosis not present

## 2022-09-08 DIAGNOSIS — H52203 Unspecified astigmatism, bilateral: Secondary | ICD-10-CM | POA: Diagnosis not present

## 2022-09-21 DIAGNOSIS — F909 Attention-deficit hyperactivity disorder, unspecified type: Secondary | ICD-10-CM | POA: Diagnosis not present

## 2022-09-21 DIAGNOSIS — F339 Major depressive disorder, recurrent, unspecified: Secondary | ICD-10-CM | POA: Diagnosis not present

## 2022-09-21 DIAGNOSIS — F419 Anxiety disorder, unspecified: Secondary | ICD-10-CM | POA: Diagnosis not present

## 2022-09-25 ENCOUNTER — Encounter: Payer: Self-pay | Admitting: Cardiology

## 2022-09-25 ENCOUNTER — Ambulatory Visit: Payer: Medicare HMO | Admitting: Cardiology

## 2022-09-25 VITALS — BP 136/77 | HR 88 | Ht 64.0 in | Wt 141.0 lb

## 2022-09-25 DIAGNOSIS — I10 Essential (primary) hypertension: Secondary | ICD-10-CM

## 2022-09-25 DIAGNOSIS — I700 Atherosclerosis of aorta: Secondary | ICD-10-CM

## 2022-09-25 DIAGNOSIS — E782 Mixed hyperlipidemia: Secondary | ICD-10-CM

## 2022-09-25 DIAGNOSIS — R931 Abnormal findings on diagnostic imaging of heart and coronary circulation: Secondary | ICD-10-CM | POA: Diagnosis not present

## 2022-09-25 NOTE — Progress Notes (Signed)
ID:  LYNDZEE SEIGAL, DOB 1956-02-28, MRN [id number]  PCP:  Wilfrid Lund, PA  Cardiologist:  Tessa Lerner, DO, [facility] (established care 09/25/22) Former Cardiology Providers: Dr. Clotilde Dieter  Chief Complaint  Patient presents with   Follow-up    Coronary calcification, secondary prevention    HPI  Melissa Carrillo is a 67 y.o. Caucasian female whose past medical history and cardiovascular risk factors include: Hypertension, hyperlipidemia, minimal coronary artery calcification (3.9, 53rd percentile), aortic atherosclerosis, ADHD, PTSD.  Patient was referred to the practice for evaluation of coronary artery disease given her family history and risk factors.  She had a coronary calcium score in October 2023 which notes minimal CAC as well has aortic atherosclerosis.  She was under the care of my partner Dr. Rozell Searing Custovic and I am seeing her for the first time.  Prior to establishing care with myself she is already undergone echo and stress test results reviewed as part of medical decision making today.  She presents today for follow-up.  Denies anginal chest pain or heart failure symptoms.  FUNCTIONAL STATUS: Enjoy working outside but no structured exercise program or daily routine.   CARDIAC DATABASE: EKG: September 25, 2022: Sinus rhythm, 74 bpm, normal axis, subtle ST depressions in lateral leads consider ischemia.  No significant change compared to prior tracing 02/16/2022.  Echocardiogram: 03/02/2022: Left ventricle cavity is normal in size and wall thickness. Normal global wall motion. Normal LV systolic function with EF 65%. Doppler evidence of grade I (impaired) diastolic dysfunction, normal LAP. No significant valvular abnormality. Normal right atrial pressure.   Stress Testing: Exercise nuclear stress test 03/30/2022 Myocardial perfusion is normal. Overall LV systolic function is normal without regional wall motion abnormalities. Stress LV EF: 60%. Low risk study. Normal  ECG stress. The patient exercised for 5 minutes and 16 seconds of a Bruce protocol, achieving approximately 7.05 METs and reached 91% MPHR. The heart rate response was normal. The blood pressure response was normal. No previous exam available for comparison.  Coronary calcification scoring: October 2023: 1. Coronary artery calcium score of 3.94, which places the patient in the 53th percentile for subjects of the same age, gender and race/ethnicity who are free of clinical cardiovascular disease and treated diabetes. 2. Aortic atherosclerosis.   ALLERGIES: Allergies  Allergen Reactions   Doxycycline     Other Reaction(s): GI Intolerance   Neomycin-Polymyxin-Gramicidin Rash    It is an over the counter item   Nickel Rash   Other Rash    It is an over the counter item    MEDICATION LIST PRIOR TO VISIT: Current Meds  Medication Sig   amphetamine-dextroamphetamine (ADDERALL XR) 30 MG 24 hr capsule Take 30 mg by mouth daily.   amphetamine-dextroamphetamine (ADDERALL) 20 MG tablet Take 20 mg by mouth daily as needed.   aspirin EC 81 MG tablet Take 81 mg by mouth daily. Swallow whole.   atorvastatin (LIPITOR) 40 MG tablet Take 40 mg by mouth daily.   Biotin 1 MG CAPS Take by mouth.   clonazePAM (KLONOPIN) 1 MG tablet Take 1 mg by mouth 2 (two) times daily as needed.   denosumab (PROLIA) 60 MG/ML SOSY injection Inject 60 mg into the skin every 6 (six) months.   escitalopram (LEXAPRO) 20 MG tablet Take 20 mg by mouth daily.   lamoTRIgine (LAMICTAL) 200 MG tablet Take 200 mg by mouth daily.   lisinopril (ZESTRIL) 10 MG tablet Take 1 tablet (10 mg total) by mouth daily.  multivitamin-lutein (OCUVITE-LUTEIN) CAPS capsule Take 1 capsule by mouth daily.   [DISCONTINUED] atorvastatin (LIPITOR) 20 MG tablet Take 20 mg by mouth daily.   [DISCONTINUED] escitalopram (LEXAPRO) 10 MG tablet Take 10 mg by mouth daily.     PAST MEDICAL HISTORY: Past Medical History:  Diagnosis Date   Depression     Hyperlipidemia     PAST SURGICAL HISTORY: Past Surgical History:  Procedure Laterality Date   CESAREAN SECTION     REDUCTION MAMMAPLASTY      FAMILY HISTORY: The patient family history includes Gout in her brother; Heart attack in her maternal grandmother; Heart attack (age of onset: 100) in her brother; Kidney failure in her father; Pancreatic cancer in her maternal aunt; Pancreatitis in her mother; Seizures in her brother; Stroke in her father and mother; Syncope episode in her sister; Thyroid cancer in her daughter.  SOCIAL HISTORY:  The patient  reports that she quit smoking about 8 years ago. Her smoking use included cigarettes. She smoked an average of .5 packs per day. She does not have any smokeless tobacco history on file. She reports current alcohol use of about 5.0 standard drinks of alcohol per week. She reports that she does not use drugs.  REVIEW OF SYSTEMS: Review of Systems  Cardiovascular:  Negative for chest pain, claudication, dyspnea on exertion, irregular heartbeat, leg swelling, near-syncope, orthopnea, palpitations, paroxysmal nocturnal dyspnea and syncope.  Respiratory:  Negative for shortness of breath.   Hematologic/Lymphatic: Negative for bleeding problem.  Musculoskeletal:  Negative for muscle cramps and myalgias.  Neurological:  Negative for dizziness and light-headedness.    PHYSICAL EXAM:    09/25/2022   12:57 PM 04/03/2022   11:38 AM 02/16/2022    1:34 PM  Vitals with BMI  Height 5\' 4"  5\' 4"  5\' 4"   Weight 141 lbs 146 lbs 10 oz 143 lbs 3 oz  BMI 24.19 25.15 24.57  Systolic 136 123 161  Diastolic 77 75 90  Pulse 88 88 74    Physical Exam  Constitutional: No distress.  Age appropriate, hemodynamically stable.   Neck: No JVD present.  Cardiovascular: Normal rate, regular rhythm, S1 normal, S2 normal, intact distal pulses and normal pulses. Exam reveals no gallop, no S3 and no S4.  No murmur heard. Pulmonary/Chest: Effort normal and breath  sounds normal. No stridor. She has no wheezes. She has no rales.  Abdominal: Soft. Bowel sounds are normal. She exhibits no distension. There is no abdominal tenderness.  Musculoskeletal:        General: No edema.     Cervical back: Neck supple.  Neurological: She is alert and oriented to person, place, and time. She has intact cranial nerves (2-12).  Skin: Skin is warm and moist.   LABORATORY DATA: Lipid Panel w/reflex Reviewed date:04/16/2022 08:12:27 AM Interpretation:LDL 121* Performing Lab: Notes/Report: Testing Performed at: [facility], [address], [place], [place] [address]  Cholesterol 194 <200 mg/dL    CHOL/HDL 3.4 5.4-0.9 Ratio    HDLD 57 30-85 mg/dL Values below 40 mg/dL indicate increased risk factor  Triglyceride 86 0-199 mg/dL    NHDL 811 9-147 mg/dL Range dependent upon risk factors.  LDL Chol Calc (NIH) 121 0-99 mg/dL      IMPRESSION:    [id number]   1. Agatston coronary artery calcium score less than 100  R93.1     2. Atherosclerosis of aorta (HCC)  I70.0     3. Benign hypertension  I10     4. Mixed  hyperlipidemia  E78.2 EKG 12-Lead       RECOMMENDATIONS: Melissa Carrillo is a 67 y.o. Caucasian female whose past medical history and cardiac risk factors include: Hypertension, hyperlipidemia, minimal coronary artery calcification (3.9, 53rd percentile), aortic atherosclerosis, ADHD, PTSD.  Agatston coronary artery calcium score less than 100 Atherosclerosis of aorta (HCC) Denies anginal chest pain or heart failure symptoms. EKG similar to prior tracings. Reviewed the echo and the stress test that were recently performed as part of medical decision making and updated above. Most recent labs from January 2024 independently reviewed. Her estimated 10-year risk of ASCVD is approximately 10.1%. After her labs in January 2024 patient states that her lipitor dose was increased to 40 mg p.o. nightly by her PCP.  Follow-up labs are still pending.  No  additional cardiovascular testing warranted at this time. Reemphasized the importance of secondary prevention with focus on improving her modifiable cardiovascular risk factors such as glycemic control, lipid management, blood pressure control, weight loss.  Benign hypertension Office blood pressures are well-controlled. Medications reconciled No changes warranted at this time  Mixed hyperlipidemia Currently on atorvastatin.   She denies myalgia or other side effects. Most recent lipids dated January 2024 reviewed as noted above. Given her most recent LDL levels, estimated 10-year risk of ASCVD currently calculated at 10.1%, agree with uptitration of Lipitor to 40 mg p.o. nightly.  I have advised the patient to have her lipids rechecked to make sure that the medications have made an impact on her lipids.  Recommend a goal LDL of 70 mg/dL if possible. Currently managed by primary care provider.    FINAL MEDICATION LIST END OF ENCOUNTER: No orders of the defined types were placed in this encounter.   Medications Discontinued During This Encounter  Medication Reason   atorvastatin (LIPITOR) 20 MG tablet Dose change   Cholecalciferol (D-3-5) 125 MCG (5000 UT) capsule    escitalopram (LEXAPRO) 10 MG tablet Dose change   hydrochlorothiazide (HYDRODIURIL) 25 MG tablet    HYDROcodone-acetaminophen (NORCO) 10-325 MG tablet      Current Outpatient Medications:    amphetamine-dextroamphetamine (ADDERALL XR) 30 MG 24 hr capsule, Take 30 mg by mouth daily., Disp: , Rfl:    amphetamine-dextroamphetamine (ADDERALL) 20 MG tablet, Take 20 mg by mouth daily as needed., Disp: , Rfl:    aspirin EC 81 MG tablet, Take 81 mg by mouth daily. Swallow whole., Disp: , Rfl:    atorvastatin (LIPITOR) 40 MG tablet, Take 40 mg by mouth daily., Disp: , Rfl:    Biotin 1 MG CAPS, Take by mouth., Disp: , Rfl:    clonazePAM (KLONOPIN) 1 MG tablet, Take 1 mg by mouth 2 (two) times daily as needed., Disp: , Rfl:     denosumab (PROLIA) 60 MG/ML SOSY injection, Inject 60 mg into the skin every 6 (six) months., Disp: , Rfl:    escitalopram (LEXAPRO) 20 MG tablet, Take 20 mg by mouth daily., Disp: , Rfl:    lamoTRIgine (LAMICTAL) 200 MG tablet, Take 200 mg by mouth daily., Disp: , Rfl:    lisinopril (ZESTRIL) 10 MG tablet, Take 1 tablet (10 mg total) by mouth daily., Disp: 90 tablet, Rfl: 3   multivitamin-lutein (OCUVITE-LUTEIN) CAPS capsule, Take 1 capsule by mouth daily., Disp: , Rfl:   Orders Placed This Encounter  Procedures   EKG 12-Lead    There are no Patient Instructions on file for this visit.   --Continue cardiac medications as reconciled in final medication list. --No follow-ups on  file. or sooner if needed. --Continue follow-up with your primary care physician regarding the management of your other chronic comorbid conditions.  Patient's questions and concerns were addressed to her satisfaction. She voices understanding of the instructions provided during this encounter.   This note was created using a voice recognition software as a result there may be grammatical errors inadvertently enclosed that do not reflect the nature of this encounter. Every attempt is made to correct such errors.  Tessa Lerner, [place], [facility]  Pager:  [phone] Office: [phone]

## 2022-10-02 ENCOUNTER — Ambulatory Visit: Payer: Medicare HMO | Admitting: Cardiology

## 2022-10-08 DIAGNOSIS — F909 Attention-deficit hyperactivity disorder, unspecified type: Secondary | ICD-10-CM | POA: Diagnosis not present

## 2022-10-08 DIAGNOSIS — F339 Major depressive disorder, recurrent, unspecified: Secondary | ICD-10-CM | POA: Diagnosis not present

## 2022-10-08 DIAGNOSIS — F419 Anxiety disorder, unspecified: Secondary | ICD-10-CM | POA: Diagnosis not present

## 2022-10-14 DIAGNOSIS — R69 Illness, unspecified: Secondary | ICD-10-CM | POA: Diagnosis not present

## 2022-10-20 DIAGNOSIS — F339 Major depressive disorder, recurrent, unspecified: Secondary | ICD-10-CM | POA: Diagnosis not present

## 2022-10-20 DIAGNOSIS — F419 Anxiety disorder, unspecified: Secondary | ICD-10-CM | POA: Diagnosis not present

## 2022-10-20 DIAGNOSIS — F909 Attention-deficit hyperactivity disorder, unspecified type: Secondary | ICD-10-CM | POA: Diagnosis not present

## 2022-11-01 DIAGNOSIS — R69 Illness, unspecified: Secondary | ICD-10-CM | POA: Diagnosis not present

## 2022-11-03 DIAGNOSIS — F419 Anxiety disorder, unspecified: Secondary | ICD-10-CM | POA: Diagnosis not present

## 2022-11-03 DIAGNOSIS — F339 Major depressive disorder, recurrent, unspecified: Secondary | ICD-10-CM | POA: Diagnosis not present

## 2022-11-03 DIAGNOSIS — F909 Attention-deficit hyperactivity disorder, unspecified type: Secondary | ICD-10-CM | POA: Diagnosis not present

## 2022-11-13 DIAGNOSIS — I700 Atherosclerosis of aorta: Secondary | ICD-10-CM | POA: Diagnosis not present

## 2022-11-13 DIAGNOSIS — F321 Major depressive disorder, single episode, moderate: Secondary | ICD-10-CM | POA: Diagnosis not present

## 2022-11-13 DIAGNOSIS — M810 Age-related osteoporosis without current pathological fracture: Secondary | ICD-10-CM | POA: Diagnosis not present

## 2022-11-13 DIAGNOSIS — F902 Attention-deficit hyperactivity disorder, combined type: Secondary | ICD-10-CM | POA: Diagnosis not present

## 2022-11-13 DIAGNOSIS — E559 Vitamin D deficiency, unspecified: Secondary | ICD-10-CM | POA: Diagnosis not present

## 2022-11-13 DIAGNOSIS — E7800 Pure hypercholesterolemia, unspecified: Secondary | ICD-10-CM | POA: Diagnosis not present

## 2022-11-13 DIAGNOSIS — G4719 Other hypersomnia: Secondary | ICD-10-CM | POA: Diagnosis not present

## 2022-11-13 DIAGNOSIS — F419 Anxiety disorder, unspecified: Secondary | ICD-10-CM | POA: Diagnosis not present

## 2022-11-13 DIAGNOSIS — F909 Attention-deficit hyperactivity disorder, unspecified type: Secondary | ICD-10-CM | POA: Diagnosis not present

## 2022-11-13 DIAGNOSIS — I10 Essential (primary) hypertension: Secondary | ICD-10-CM | POA: Diagnosis not present

## 2022-11-13 DIAGNOSIS — F339 Major depressive disorder, recurrent, unspecified: Secondary | ICD-10-CM | POA: Diagnosis not present

## 2022-11-13 DIAGNOSIS — R0982 Postnasal drip: Secondary | ICD-10-CM | POA: Diagnosis not present

## 2022-11-17 DIAGNOSIS — E559 Vitamin D deficiency, unspecified: Secondary | ICD-10-CM | POA: Diagnosis not present

## 2022-11-17 DIAGNOSIS — E7800 Pure hypercholesterolemia, unspecified: Secondary | ICD-10-CM | POA: Diagnosis not present

## 2022-12-01 DIAGNOSIS — F419 Anxiety disorder, unspecified: Secondary | ICD-10-CM | POA: Diagnosis not present

## 2022-12-01 DIAGNOSIS — F909 Attention-deficit hyperactivity disorder, unspecified type: Secondary | ICD-10-CM | POA: Diagnosis not present

## 2022-12-01 DIAGNOSIS — F339 Major depressive disorder, recurrent, unspecified: Secondary | ICD-10-CM | POA: Diagnosis not present

## 2022-12-09 DIAGNOSIS — R0683 Snoring: Secondary | ICD-10-CM | POA: Diagnosis not present

## 2022-12-13 IMAGING — MG MM DIGITAL DIAGNOSTIC UNILAT*R* W/ TOMO W/ CAD
4 series · 4 of 12 positions shown · non-contrast
Comparison: Previous exam(s).

CLINICAL DATA: 66-year-old female presenting as a recall from
baseline screening for possible right breast mass.

EXAM:
DIGITAL DIAGNOSTIC UNILATERAL RIGHT MAMMOGRAM WITH TOMOSYNTHESIS AND
CAD; ULTRASOUND RIGHT BREAST LIMITED
TECHNIQUE: Right digital diagnostic mammography and breast tomosynthesis was
performed. The images were evaluated with computer-aided detection.;
Targeted ultrasound examination of the right breast was performed

[R CC synth-2D]
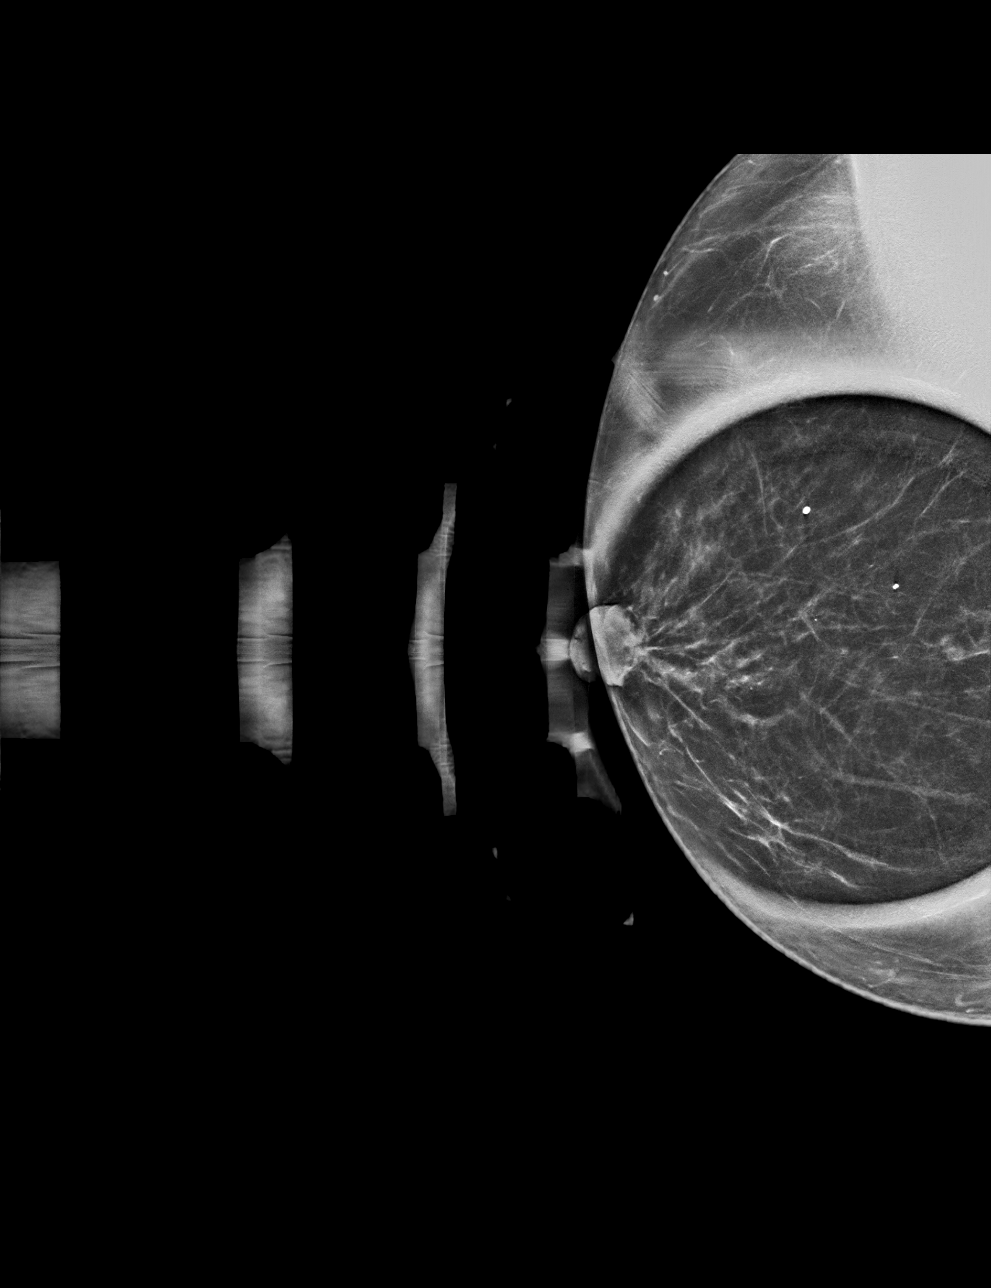

[R MLO synth-2D]
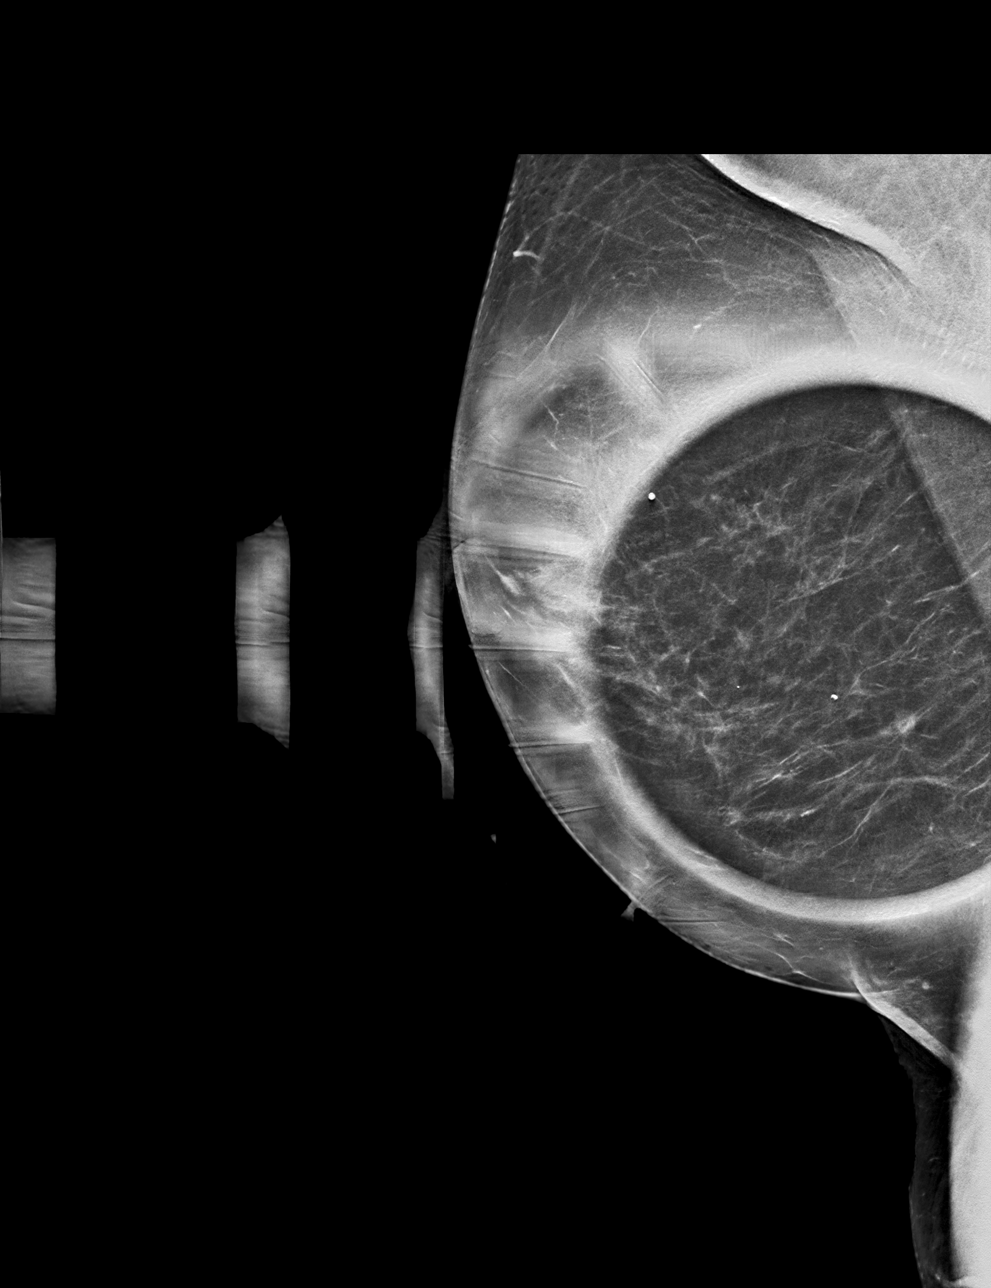

[R CC tomo · tomo slice 33/66.0]
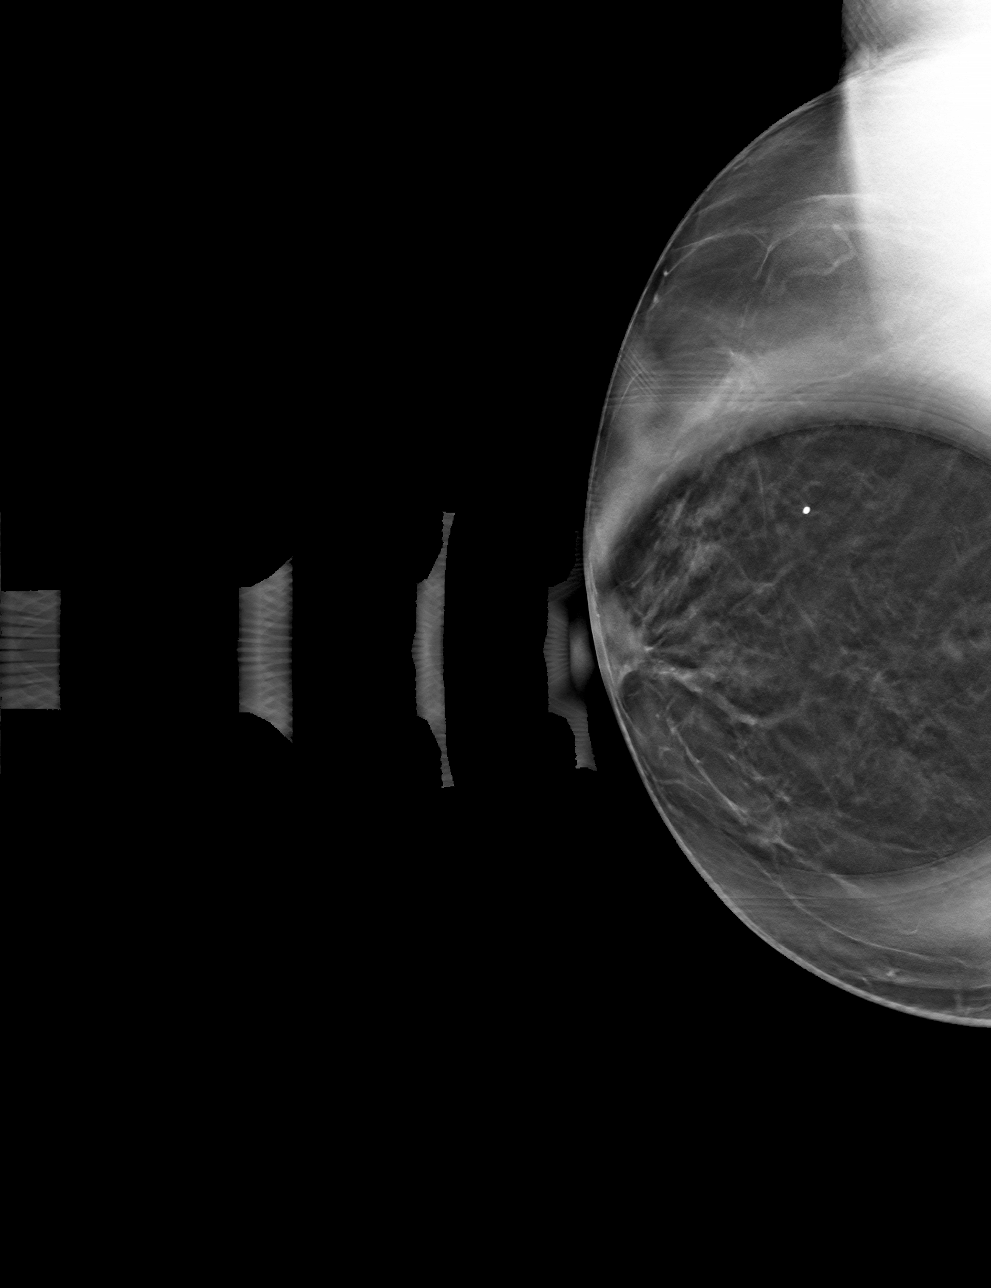

[R MLO tomo · tomo slice 35/70.0]
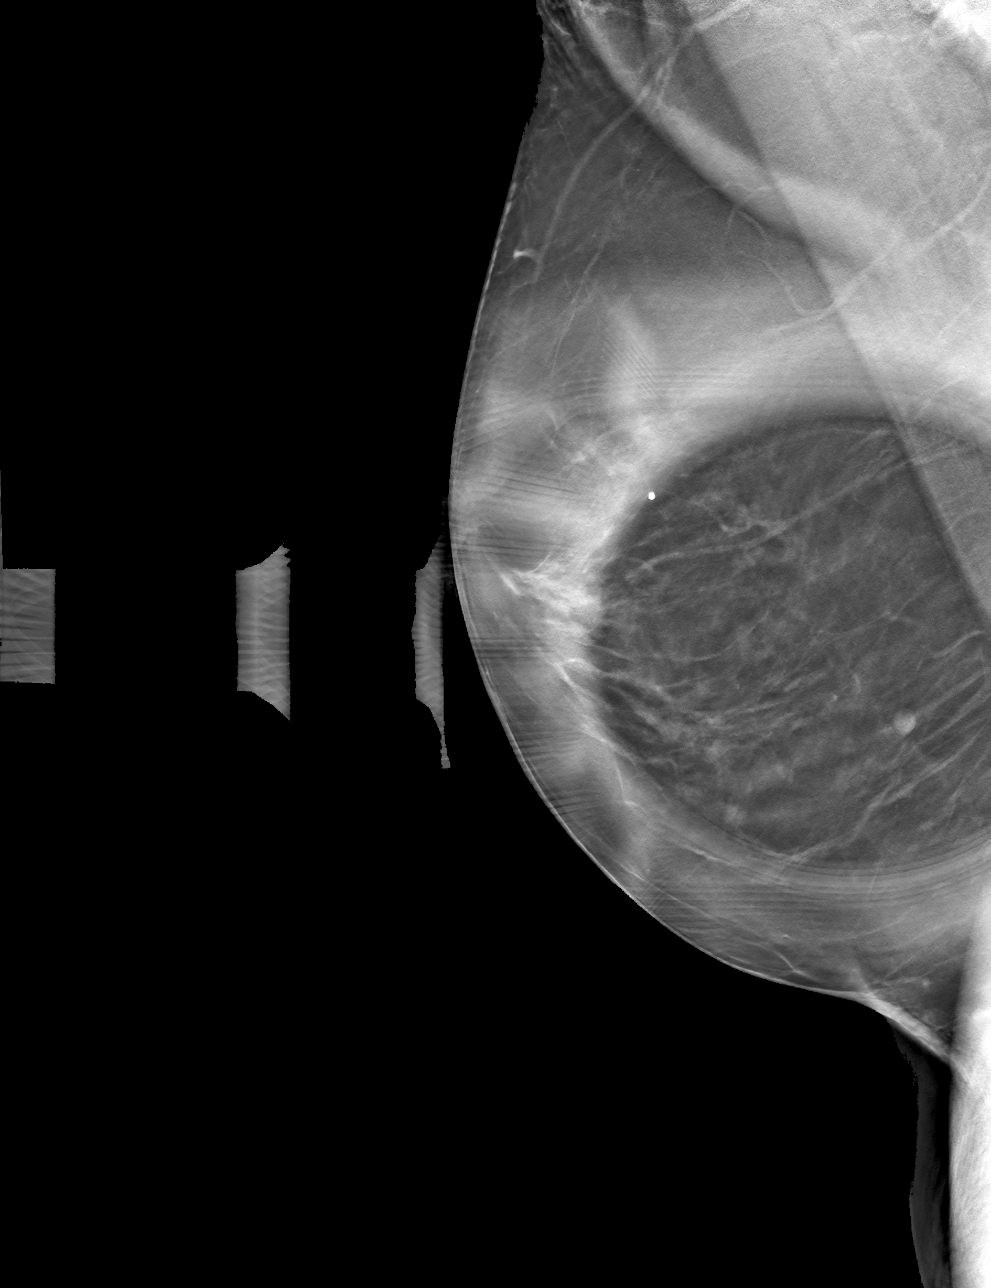

[4 of 12 positions shown; findings below may reference images not displayed]

ACR Breast Density Category b: There are scattered areas of
fibroglandular density.
FINDINGS: Mammogram:

Right breast: Spot compression tomosynthesis views of the right
breast were performed demonstrating persistence of a tiny oval
circumscribed mass in the central inferior right breast posterior
depth.

Ultrasound:

Targeted ultrasound is performed in the right breast at 6 o'clock 3
cm from the nipple demonstrating an oval circumscribed hypoechoic
mass measuring 0.4 x 0.2 x 0.4 cm, most likely a complicated cyst.
No internal vascularity. This corresponds to the mammographic
finding.
IMPRESSION: Probably benign mass in the right breast at 6 o'clock, likely a
complicated cyst.

RECOMMENDATION:
Diagnostic right breast mammogram and ultrasound in 6 months.

I have discussed the findings and recommendations with the patient
who agrees to short-term follow-up. If applicable, a reminder letter
will be sent to the patient regarding the next appointment.

BI-RADS CATEGORY  3: Probably benign.

## 2022-12-16 DIAGNOSIS — F339 Major depressive disorder, recurrent, unspecified: Secondary | ICD-10-CM | POA: Diagnosis not present

## 2022-12-16 DIAGNOSIS — F909 Attention-deficit hyperactivity disorder, unspecified type: Secondary | ICD-10-CM | POA: Diagnosis not present

## 2022-12-16 DIAGNOSIS — F419 Anxiety disorder, unspecified: Secondary | ICD-10-CM | POA: Diagnosis not present

## 2022-12-22 DIAGNOSIS — G4710 Hypersomnia, unspecified: Secondary | ICD-10-CM | POA: Diagnosis not present

## 2022-12-23 DIAGNOSIS — F332 Major depressive disorder, recurrent severe without psychotic features: Secondary | ICD-10-CM | POA: Diagnosis not present

## 2022-12-24 DIAGNOSIS — R69 Illness, unspecified: Secondary | ICD-10-CM | POA: Diagnosis not present

## 2022-12-30 DIAGNOSIS — F419 Anxiety disorder, unspecified: Secondary | ICD-10-CM | POA: Diagnosis not present

## 2022-12-30 DIAGNOSIS — F339 Major depressive disorder, recurrent, unspecified: Secondary | ICD-10-CM | POA: Diagnosis not present

## 2022-12-30 DIAGNOSIS — F909 Attention-deficit hyperactivity disorder, unspecified type: Secondary | ICD-10-CM | POA: Diagnosis not present

## 2023-01-07 DIAGNOSIS — F332 Major depressive disorder, recurrent severe without psychotic features: Secondary | ICD-10-CM | POA: Diagnosis not present

## 2023-01-08 DIAGNOSIS — F332 Major depressive disorder, recurrent severe without psychotic features: Secondary | ICD-10-CM | POA: Diagnosis not present

## 2023-01-11 DIAGNOSIS — F332 Major depressive disorder, recurrent severe without psychotic features: Secondary | ICD-10-CM | POA: Diagnosis not present

## 2023-01-12 DIAGNOSIS — F339 Major depressive disorder, recurrent, unspecified: Secondary | ICD-10-CM | POA: Diagnosis not present

## 2023-01-12 DIAGNOSIS — F332 Major depressive disorder, recurrent severe without psychotic features: Secondary | ICD-10-CM | POA: Diagnosis not present

## 2023-01-12 DIAGNOSIS — F419 Anxiety disorder, unspecified: Secondary | ICD-10-CM | POA: Diagnosis not present

## 2023-01-12 DIAGNOSIS — F909 Attention-deficit hyperactivity disorder, unspecified type: Secondary | ICD-10-CM | POA: Diagnosis not present

## 2023-01-13 DIAGNOSIS — F332 Major depressive disorder, recurrent severe without psychotic features: Secondary | ICD-10-CM | POA: Diagnosis not present

## 2023-01-14 DIAGNOSIS — F332 Major depressive disorder, recurrent severe without psychotic features: Secondary | ICD-10-CM | POA: Diagnosis not present

## 2023-01-15 DIAGNOSIS — F332 Major depressive disorder, recurrent severe without psychotic features: Secondary | ICD-10-CM | POA: Diagnosis not present

## 2023-01-18 ENCOUNTER — Ambulatory Visit (INDEPENDENT_AMBULATORY_CARE_PROVIDER_SITE_OTHER): Payer: Medicare HMO | Admitting: Otolaryngology

## 2023-01-18 ENCOUNTER — Encounter (INDEPENDENT_AMBULATORY_CARE_PROVIDER_SITE_OTHER): Payer: Self-pay | Admitting: Otolaryngology

## 2023-01-18 VITALS — BP 166/81 | HR 95 | Ht 64.0 in | Wt 139.0 lb

## 2023-01-18 DIAGNOSIS — J3089 Other allergic rhinitis: Secondary | ICD-10-CM | POA: Diagnosis not present

## 2023-01-18 DIAGNOSIS — J343 Hypertrophy of nasal turbinates: Secondary | ICD-10-CM | POA: Diagnosis not present

## 2023-01-18 DIAGNOSIS — R0981 Nasal congestion: Secondary | ICD-10-CM

## 2023-01-18 DIAGNOSIS — J329 Chronic sinusitis, unspecified: Secondary | ICD-10-CM | POA: Diagnosis not present

## 2023-01-18 DIAGNOSIS — J342 Deviated nasal septum: Secondary | ICD-10-CM | POA: Diagnosis not present

## 2023-01-18 DIAGNOSIS — F332 Major depressive disorder, recurrent severe without psychotic features: Secondary | ICD-10-CM | POA: Diagnosis not present

## 2023-01-18 DIAGNOSIS — R0982 Postnasal drip: Secondary | ICD-10-CM

## 2023-01-18 MED ORDER — IPRATROPIUM BROMIDE 0.03 % NA SOLN
2.0000 | Freq: Two times a day (BID) | NASAL | 12 refills | Status: DC
Start: 2023-01-18 — End: 2023-03-12

## 2023-01-18 MED ORDER — CETIRIZINE HCL 10 MG PO TABS
10.0000 mg | ORAL_TABLET | Freq: Every day | ORAL | 11 refills | Status: DC
Start: 2023-01-18 — End: 2023-03-12

## 2023-01-18 NOTE — Patient Instructions (Addendum)
-   stop Flonase and start steroid rinses  - start Ipratropium Bromide nasal spray - start allergy pill if able to tolerate  - you will get a call from the compounding pharmacy to get nasal steroid rinses - schedule CT sinuses  - return after imaging

## 2023-01-18 NOTE — Progress Notes (Signed)
ENT CONSULT:  Reason for Consult: chronic post-nasal drip   HPI: Melissa Carrillo is an 67 y.o. female with hx of long-standing environmental allergies and nasal congestion is here for chronic post-nasal drainage.  She uses Flonase and nasal saline rinses daily and rinses out yellow thick secretions. She drinks plenty of water to try and loosen the secretions. No hx of sinus and nasal surgery. She had allergy testing long time ago ~ 20 yrs ago, no allergy shots in the past. She thinks she had nasal trauma before (was told her nose looked like it was broken). She has multiple allergies based on testing in the past. She has had sinus infections in the past, never goes to see a physician. She has good sense of smell. No sinus imaging  Records Reviewed:  Cardiology Note 09/25/2022 DEVLIN CANTRELLE is a 67 y.o. Caucasian female whose past medical history and cardiovascular risk factors include: Hypertension, hyperlipidemia, minimal coronary artery calcification (3.9, 53rd percentile), aortic atherosclerosis, ADHD, PTSD.   Patient was referred to the practice for evaluation of coronary artery disease given her family history and risk factors.   She had a coronary calcium score in October 2023 which notes minimal CAC as well has aortic atherosclerosis.   She was under the care of my partner Dr. Rozell Searing Custovic and I am seeing her for the first time.   Prior to establishing care with myself she is already undergone echo and stress test results reviewed as part of medical decision making today.   She presents today for follow-up.  Denies anginal chest pain or heart failure symptoms.   Office note by PA Shaaron Adler 10/13/21  "67 y.o. female who presents with concern for the possibility of Lyme disease after 2 tick bites The bites were earlier this summer, one to her right buttock one to the left posterior neck. The one on the buttock was a tick likely from [place], while the one to her neck was  likely from her back yard. Each tick was engorged when pulled off, the one to her neck moreso. There was a small rash around the one to her buttock that resolved and she denies any subsequent rash or skin discoloration. She does feel like the spot to the back of her neck won't heal. Has been using rubbing alcohol to it.  She is feeling well. Denies headaches, confusion, balance loss, joint swelling.  She would like serology for peace of mind, as she was rx doxycycline 6 weeks ago but could not tolerate more than 3 days of the medication due to severe GI side effects. "   Past Medical History:  Diagnosis Date   Depression    Hyperlipidemia     Past Surgical History:  Procedure Laterality Date   CESAREAN SECTION     REDUCTION MAMMAPLASTY      Family History  Problem Relation Age of Onset   Stroke Mother    Pancreatitis Mother    Stroke Father    Kidney failure Father    Syncope episode Sister    Heart attack Brother 4   Gout Brother    Seizures Brother    Pancreatic cancer Maternal Aunt    Heart attack Maternal Grandmother    Thyroid cancer Daughter     Social History:  reports that she quit smoking about 8 years ago. Her smoking use included cigarettes. She does not have any smokeless tobacco history on file. She reports current alcohol use of about 5.0 standard drinks  of alcohol per week. She reports that she does not use drugs.  Allergies:  Allergies  Allergen Reactions   Doxycycline     Other Reaction(s): GI Intolerance   Bacitracin-Polymyxin B Rash   Neomycin-Polymyxin-Gramicidin Rash    It is an over the counter item   Nickel Rash   Other Rash    It is an over the counter item    Medications: I have reviewed the patient's current medications.  The PMH, PSH, Medications, Allergies, and SH were reviewed and updated.  ROS: Constitutional: Negative for fever, weight loss and weight gain. Cardiovascular: Negative for chest pain and dyspnea on  exertion. Respiratory: Is not experiencing shortness of breath at rest. Gastrointestinal: Negative for nausea and vomiting. Neurological: Negative for headaches. Psychiatric: The patient is not nervous/anxious  Blood pressure (!) 166/81, pulse 95, height 5\' 4"  (1.626 m), weight 139 lb (63 kg), SpO2 96%.  PHYSICAL EXAM:  Exam: General: Well-developed, well-nourished Respiratory Respiratory effort: Equal inspiration and expiration without stridor Cardiovascular Peripheral Vascular: Warm extremities with equal color/perfusion Eyes: No nystagmus with equal extraocular motion bilaterally Neuro/Psych/Balance: Patient oriented to person, place, and time; Appropriate mood and affect; Gait is intact with no imbalance; Cranial nerves I-XII are intact Head and Face Inspection: Normocephalic and atraumatic without mass or lesion Palpation: Facial skeleton intact without bony stepoffs Salivary Glands: No mass or tenderness Facial Strength: Facial motility symmetric and full bilaterally ENT Pinna: External ear intact and fully developed External canal: Canal is patent with intact skin Tympanic Membrane: Clear and mobile External Nose: No scar or anatomic deformity Internal Nose: Septum is deviated to the right with caudal spur and narrowing of the right nasal passage. No polyp, or purulence. Mucosal edema and erythema present.  Bilateral inferior turbinate hypertrophy.  Lips, Teeth, and gums: Mucosa and teeth intact and viable TMJ: No pain to palpation with full mobility Oral cavity/oropharynx: No erythema or exudate, no lesions present Nasopharynx: No mass or lesion with intact mucosa Neck Neck and Trachea: Midline trachea without mass or lesion Thyroid: No mass or nodularity Lymphatics: No lymphadenopathy  Procedure:   PROCEDURE NOTE: nasal endoscopy  Preoperative diagnosis: chronic sinusitis symptoms  Postoperative diagnosis: same  Procedure: Diagnostic nasal endoscopy  ([id number])  Surgeon: Ashok Croon, M.D.  Anesthesia: Topical lidocaine and Afrin  H&P REVIEW: The patient's history and physical were reviewed today prior to procedure. All medications were reviewed and updated as well. Complications: None Condition is stable throughout exam Indications and consent: The patient presents with symptoms of chronic sinusitis not responding to previous therapies. All the risks, benefits, and potential complications were reviewed with the patient preoperatively and informed consent was obtained. The time out was completed with confirmation of the correct procedure.   Procedure: The patient was seated upright in the clinic. Topical lidocaine and Afrin were applied to the nasal cavity. After adequate anesthesia had occurred, the rigid nasal endoscope was passed into the nasal cavity. The nasal mucosa, turbinates, septum, and sinus drainage pathways were visualized bilaterally. This revealed no purulence or significant secretions that might be cultured. There were no polyps or sites of significant inflammation. The mucosa was intact and there was no crusting present. The scope was then slowly withdrawn and the patient tolerated the procedure well. There were no complications or blood loss.   Assessment/Plan: Encounter Diagnoses  Name Primary?   Chronic sinusitis, unspecified location    Post-nasal drip    Chronic nasal congestion Yes   Nasal septal deviation    Hypertrophy  of both inferior nasal turbinates    Environmental and seasonal allergies     Chronic nasal congestion and post-nasal drainage with yellow mucus when doing nasal saline rinses, already on Flonase without significant relief, hx of (+) allergy testing in the past no sinus surgery or allergy shots - exam including nasal endoscopy with right sided NSD and ITH, edema of nasal mucosa but no purulence or polyps - CT sinuses to evaluate for chronic sinus inflammation  - stop Flonase, start nasal steroid  rinses with mometasone - Rx sent today  - start Zyrtec 10 mg daily  - start Ipratropium Bromide   2. Environmental allergies  - medical management as above  - will consider allergy testing in the future   3. Septal deviation and ITH  - medical management as above, will consider surgical options if fails medical management    2. Environmental allergies   Thank you for allowing me to participate in the care of this patient. Please do not hesitate to contact me with any questions or concerns.   Ashok Croon, MD Otolaryngology [facility] Phone: [phone] Fax: [phone]    01/18/2023, 12:45 PM

## 2023-01-19 DIAGNOSIS — F332 Major depressive disorder, recurrent severe without psychotic features: Secondary | ICD-10-CM | POA: Diagnosis not present

## 2023-01-20 DIAGNOSIS — F332 Major depressive disorder, recurrent severe without psychotic features: Secondary | ICD-10-CM | POA: Diagnosis not present

## 2023-01-21 DIAGNOSIS — F332 Major depressive disorder, recurrent severe without psychotic features: Secondary | ICD-10-CM | POA: Diagnosis not present

## 2023-01-22 DIAGNOSIS — F332 Major depressive disorder, recurrent severe without psychotic features: Secondary | ICD-10-CM | POA: Diagnosis not present

## 2023-01-25 DIAGNOSIS — L578 Other skin changes due to chronic exposure to nonionizing radiation: Secondary | ICD-10-CM | POA: Diagnosis not present

## 2023-01-25 DIAGNOSIS — D225 Melanocytic nevi of trunk: Secondary | ICD-10-CM | POA: Diagnosis not present

## 2023-01-25 DIAGNOSIS — Z08 Encounter for follow-up examination after completed treatment for malignant neoplasm: Secondary | ICD-10-CM | POA: Diagnosis not present

## 2023-01-25 DIAGNOSIS — L821 Other seborrheic keratosis: Secondary | ICD-10-CM | POA: Diagnosis not present

## 2023-01-25 DIAGNOSIS — Z85828 Personal history of other malignant neoplasm of skin: Secondary | ICD-10-CM | POA: Diagnosis not present

## 2023-01-25 DIAGNOSIS — L814 Other melanin hyperpigmentation: Secondary | ICD-10-CM | POA: Diagnosis not present

## 2023-01-25 DIAGNOSIS — F332 Major depressive disorder, recurrent severe without psychotic features: Secondary | ICD-10-CM | POA: Diagnosis not present

## 2023-01-26 DIAGNOSIS — F332 Major depressive disorder, recurrent severe without psychotic features: Secondary | ICD-10-CM | POA: Diagnosis not present

## 2023-01-26 DIAGNOSIS — F902 Attention-deficit hyperactivity disorder, combined type: Secondary | ICD-10-CM | POA: Diagnosis not present

## 2023-01-26 DIAGNOSIS — F4320 Adjustment disorder, unspecified: Secondary | ICD-10-CM | POA: Diagnosis not present

## 2023-01-26 DIAGNOSIS — Z5181 Encounter for therapeutic drug level monitoring: Secondary | ICD-10-CM | POA: Diagnosis not present

## 2023-01-26 DIAGNOSIS — F3181 Bipolar II disorder: Secondary | ICD-10-CM | POA: Diagnosis not present

## 2023-01-27 DIAGNOSIS — F419 Anxiety disorder, unspecified: Secondary | ICD-10-CM | POA: Diagnosis not present

## 2023-01-27 DIAGNOSIS — F909 Attention-deficit hyperactivity disorder, unspecified type: Secondary | ICD-10-CM | POA: Diagnosis not present

## 2023-01-27 DIAGNOSIS — F339 Major depressive disorder, recurrent, unspecified: Secondary | ICD-10-CM | POA: Diagnosis not present

## 2023-01-28 DIAGNOSIS — F332 Major depressive disorder, recurrent severe without psychotic features: Secondary | ICD-10-CM | POA: Diagnosis not present

## 2023-02-03 DIAGNOSIS — R0683 Snoring: Secondary | ICD-10-CM | POA: Diagnosis not present

## 2023-02-04 DIAGNOSIS — F332 Major depressive disorder, recurrent severe without psychotic features: Secondary | ICD-10-CM | POA: Diagnosis not present

## 2023-02-04 DIAGNOSIS — R0683 Snoring: Secondary | ICD-10-CM | POA: Diagnosis not present

## 2023-02-05 DIAGNOSIS — F332 Major depressive disorder, recurrent severe without psychotic features: Secondary | ICD-10-CM | POA: Diagnosis not present

## 2023-02-08 DIAGNOSIS — F332 Major depressive disorder, recurrent severe without psychotic features: Secondary | ICD-10-CM | POA: Diagnosis not present

## 2023-02-10 DIAGNOSIS — F332 Major depressive disorder, recurrent severe without psychotic features: Secondary | ICD-10-CM | POA: Diagnosis not present

## 2023-02-11 DIAGNOSIS — F332 Major depressive disorder, recurrent severe without psychotic features: Secondary | ICD-10-CM | POA: Diagnosis not present

## 2023-02-12 DIAGNOSIS — F332 Major depressive disorder, recurrent severe without psychotic features: Secondary | ICD-10-CM | POA: Diagnosis not present

## 2023-02-12 DIAGNOSIS — F909 Attention-deficit hyperactivity disorder, unspecified type: Secondary | ICD-10-CM | POA: Diagnosis not present

## 2023-02-12 DIAGNOSIS — F339 Major depressive disorder, recurrent, unspecified: Secondary | ICD-10-CM | POA: Diagnosis not present

## 2023-02-12 DIAGNOSIS — F419 Anxiety disorder, unspecified: Secondary | ICD-10-CM | POA: Diagnosis not present

## 2023-02-15 DIAGNOSIS — E7800 Pure hypercholesterolemia, unspecified: Secondary | ICD-10-CM | POA: Diagnosis not present

## 2023-02-15 DIAGNOSIS — F332 Major depressive disorder, recurrent severe without psychotic features: Secondary | ICD-10-CM | POA: Diagnosis not present

## 2023-02-22 DIAGNOSIS — F909 Attention-deficit hyperactivity disorder, unspecified type: Secondary | ICD-10-CM | POA: Diagnosis not present

## 2023-02-22 DIAGNOSIS — F419 Anxiety disorder, unspecified: Secondary | ICD-10-CM | POA: Diagnosis not present

## 2023-02-22 DIAGNOSIS — F339 Major depressive disorder, recurrent, unspecified: Secondary | ICD-10-CM | POA: Diagnosis not present

## 2023-03-01 DIAGNOSIS — F332 Major depressive disorder, recurrent severe without psychotic features: Secondary | ICD-10-CM | POA: Diagnosis not present

## 2023-03-09 ENCOUNTER — Ambulatory Visit (HOSPITAL_COMMUNITY)
Admission: RE | Admit: 2023-03-09 | Discharge: 2023-03-09 | Disposition: A | Discharge status: Home / Self Care | Payer: Medicare HMO | Source: Ambulatory Visit | Attending: Otolaryngology | Admitting: Otolaryngology

## 2023-03-09 DIAGNOSIS — J329 Chronic sinusitis, unspecified: Secondary | ICD-10-CM | POA: Diagnosis not present

## 2023-03-09 DIAGNOSIS — J342 Deviated nasal septum: Secondary | ICD-10-CM | POA: Diagnosis not present

## 2023-03-10 DIAGNOSIS — F909 Attention-deficit hyperactivity disorder, unspecified type: Secondary | ICD-10-CM | POA: Diagnosis not present

## 2023-03-10 DIAGNOSIS — F419 Anxiety disorder, unspecified: Secondary | ICD-10-CM | POA: Diagnosis not present

## 2023-03-10 DIAGNOSIS — F339 Major depressive disorder, recurrent, unspecified: Secondary | ICD-10-CM | POA: Diagnosis not present

## 2023-03-12 ENCOUNTER — Ambulatory Visit (INDEPENDENT_AMBULATORY_CARE_PROVIDER_SITE_OTHER): Payer: Medicare HMO | Admitting: Otolaryngology

## 2023-03-12 ENCOUNTER — Encounter (INDEPENDENT_AMBULATORY_CARE_PROVIDER_SITE_OTHER): Payer: Self-pay | Admitting: Otolaryngology

## 2023-03-12 VITALS — BP 125/69 | HR 102

## 2023-03-12 DIAGNOSIS — R0982 Postnasal drip: Secondary | ICD-10-CM | POA: Diagnosis not present

## 2023-03-12 DIAGNOSIS — J342 Deviated nasal septum: Secondary | ICD-10-CM | POA: Diagnosis not present

## 2023-03-12 DIAGNOSIS — R0981 Nasal congestion: Secondary | ICD-10-CM | POA: Diagnosis not present

## 2023-03-12 DIAGNOSIS — J343 Hypertrophy of nasal turbinates: Secondary | ICD-10-CM

## 2023-03-12 DIAGNOSIS — J3089 Other allergic rhinitis: Secondary | ICD-10-CM

## 2023-03-12 MED ORDER — IPRATROPIUM BROMIDE 0.03 % NA SOLN: 2.0000 | Freq: Two times a day (BID) | NASAL | 12 refills | Status: DC

## 2023-03-12 MED ORDER — CETIRIZINE HCL 10 MG PO TABS
10.0000 mg | ORAL_TABLET | Freq: Every day | ORAL | 11 refills | Status: DC
Start: 2023-03-12 — End: 2023-10-04

## 2023-03-12 NOTE — Progress Notes (Unsigned)
ENT CONSULT:  Update 03/12/2023   Discussed the use of AI scribe software for clinical note transcription with the patient, who gave verbal consent to proceed.  History of Present Illness   The patient, with a history of nasal congestion and postnasal drainage issues, reports an improvement in her symptoms. They recently experienced upper respiratory infection cold-like symptoms, but interestingly, did not have the usual postnasal drainage associated with such an illness. The patient is currently on a regimen of Zyrtec and ipratropium bromide, and performs a nasal rinse with steroid once daily. They have noticed a positive difference since starting this regimen.  Prior nasal endoscopy with evidence of septal spur. They speculate that this may have been caused by an accident involving their son when he was a baby.   The primary issue for the patient is postnasal drainage, which they describe as changing colors from time to time. They express concern about potential exposure to irritants or pathogens while working outside, which they enjoy. However, they do not report any cough unless they have a cold.  She had CT of the sinuses which demonstrated clear paranasal sinuses.  Initial evaluation 01/18/2023 Reason for Consult: chronic post-nasal drip   HPI: Melissa Carrillo is an 67 y.o. female with hx of long-standing environmental allergies and nasal congestion is here for chronic post-nasal drainage.  She uses Flonase and nasal saline rinses daily and rinses out yellow thick secretions. She drinks plenty of water to try and loosen the secretions. No hx of sinus and nasal surgery. She had allergy testing long time ago ~ 20 yrs ago, no allergy shots in the past. She thinks she had nasal trauma before (was told her nose looked like it was broken). She has multiple allergies based on testing in the past. She has had sinus infections in the past, never goes to see a physician. She has good sense of smell.  No sinus imaging  Records Reviewed:  Cardiology Note 09/25/2022 Melissa Carrillo is a 67 y.o. Caucasian female whose past medical history and cardiovascular risk factors include: Hypertension, hyperlipidemia, minimal coronary artery calcification (3.9, 53rd percentile), aortic atherosclerosis, ADHD, PTSD.   Patient was referred to the practice for evaluation of coronary artery disease given her family history and risk factors.   She had a coronary calcium score in October 2023 which notes minimal CAC as well has aortic atherosclerosis.   She was under the care of my partner Dr. Rozell Searing Custovic and I am seeing her for the first time.   Prior to establishing care with myself she is already undergone echo and stress test results reviewed as part of medical decision making today.   She presents today for follow-up.  Denies anginal chest pain or heart failure symptoms.   Office note by PA Shaaron Adler 10/13/21  "67 y.o. female who presents with concern for the possibility of Lyme disease after 2 tick bites The bites were earlier this summer, one to her right buttock one to the left posterior neck. The one on the buttock was a tick likely from [place], while the one to her neck was likely from her back yard. Each tick was engorged when pulled off, the one to her neck moreso. There was a small rash around the one to her buttock that resolved and she denies any subsequent rash or skin discoloration. She does feel like the spot to the back of her neck won't heal. Has been using rubbing alcohol to it.  She is feeling  well. Denies headaches, confusion, balance loss, joint swelling.  She would like serology for peace of mind, as she was rx doxycycline 6 weeks ago but could not tolerate more than 3 days of the medication due to severe GI side effects. "   Past Medical History:  Diagnosis Date   Depression    Hyperlipidemia     Past Surgical History:  Procedure Laterality Date   CESAREAN  SECTION     REDUCTION MAMMAPLASTY      Family History  Problem Relation Age of Onset   Stroke Mother    Pancreatitis Mother    Stroke Father    Kidney failure Father    Syncope episode Sister    Heart attack Brother 39   Gout Brother    Seizures Brother    Pancreatic cancer Maternal Aunt    Heart attack Maternal Grandmother    Thyroid cancer Daughter     Social History:  reports that she quit smoking about 8 years ago. Her smoking use included cigarettes. She does not have any smokeless tobacco history on file. She reports current alcohol use of about 5.0 standard drinks of alcohol per week. She reports that she does not use drugs.  Allergies:  Allergies  Allergen Reactions   Doxycycline     Other Reaction(s): GI Intolerance   Bacitracin-Polymyxin B Rash   Neomycin-Polymyxin-Gramicidin Rash    It is an over the counter item   Nickel Rash   Other Rash    It is an over the counter item    Medications: I have reviewed the patient's current medications.  The PMH, PSH, Medications, Allergies, and SH were reviewed and updated.  ROS: Constitutional: Negative for fever, weight loss and weight gain. Cardiovascular: Negative for chest pain and dyspnea on exertion. Respiratory: Is not experiencing shortness of breath at rest. Gastrointestinal: Negative for nausea and vomiting. Neurological: Negative for headaches. Psychiatric: The patient is not nervous/anxious  Blood pressure 125/69, pulse (!) 102, SpO2 96%.  PHYSICAL EXAM:  Exam: General: Well-developed, well-nourished Respiratory Respiratory effort: Equal inspiration and expiration without stridor Cardiovascular Peripheral Vascular: Warm extremities with equal color/perfusion Eyes: No nystagmus with equal extraocular motion bilaterally Neuro/Psych/Balance: Patient oriented to person, place, and time; Appropriate mood and affect; Gait is intact with no imbalance; Cranial nerves I-XII are intact Head and  Face Inspection: Normocephalic and atraumatic without mass or lesion Palpation: Facial skeleton intact without bony stepoffs Salivary Glands: No mass or tenderness Facial Strength: Facial motility symmetric and full bilaterally ENT Pinna: External ear intact and fully developed External canal: Canal is patent with intact skin Tympanic Membrane: Clear and mobile External Nose: No scar or anatomic deformity Lips, Teeth, and gums: Mucosa and teeth intact and viable Oral cavity/oropharynx: No erythema or exudate, no lesions present Neck Neck and Trachea: Midline trachea without mass or lesion Thyroid: No mass or nodularity Lymphatics: No lymphadenopathy  CT sinuses 03/09/23 FINDINGS: Paranasal sinuses:   Frontal: Normally aerated. Patent frontal sinus drainage pathways.   Ethmoid: Normally aerated.   Maxillary: Trace bilateral maxillary floor mucosal thickening.   Sphenoid: Normally aerated. Patent sphenoethmoidal recesses.   Right ostiomeatal unit: Patent.   Left ostiomeatal unit: Patent.   Nasal passages: Patent. Moderate rightward deviation of the nasal septum. Rightward projecting osseous spur without turbinate contact.   Other: Orbits and intracranial compartment are unremarkable. Visible mastoid air cells are normally aerated.   IMPRESSION: 1. Trace bilateral maxillary floor mucosal thickening. 2. Patent sinus drainage pathways. 3. Moderate rightward deviation of  the nasal septum  Assessment/Plan: Encounter Diagnoses  Name Primary?   Post-nasal drip Yes   Chronic nasal congestion    Environmental and seasonal allergies    Hypertrophy of both inferior nasal turbinates    Nasal septal deviation      Chronic nasal congestion and post-nasal drainage with yellow mucus when doing nasal saline rinses, already on Flonase without significant relief, hx of (+) allergy testing in the past no sinus surgery or allergy shots - exam including nasal endoscopy with right  sided NSD and ITH, edema of nasal mucosa but no purulence or polyps - CT sinuses to evaluate for chronic sinus inflammation  - stop Flonase, start nasal steroid rinses with mometasone - Rx sent today  - start Zyrtec 10 mg daily  - start Ipratropium Bromide   2. Environmental allergies  - medical management as above  - will consider allergy testing in the future   3. Septal deviation and ITH  - medical management as above, will consider surgical options if fails medical management   Update 03/12/2023  Chronic nasal congestion and post-nasal drainage with yellow mucus when doing nasal saline rinses, already on Flonase without significant relief, hx of (+) allergy testing in the past no sinus surgery or allergy shots.  Her initial evaluation demonstrated rightward septal deviation inferior turban hypertrophy but no polyps or pus.  Since initial evaluation she initiated nasal rinses with steroid and saline solution and appears to be doing much better on this regimen from the symptom standpoint. - CT sinuses demonstrated clear paranasal sinuses without evidence of chronic sinusitis -we reviewed her imaging study results today -Continue nasal steroid rinses with mometasone  -Continue Zyrtec 10 mg daily  -Continue Ipratropium Bromide 2 puffs bilateral nares   2. Environmental allergies.  History of positive allergy testing reported by patient - medical management as above  - will consider allergy re-testing in the future if she elects to consider allergy shots  3. Septal deviation to the right and ITH  - medical management as above, will consider surgical options if fails medical management  Follow-up -Return in 6 months for symptom evaluation and further management  Ashok Croon, MD Otolaryngology [facility] Phone: [phone] Fax: [phone]    03/13/2023, 9:10 AM

## 2023-03-29 DIAGNOSIS — F909 Attention-deficit hyperactivity disorder, unspecified type: Secondary | ICD-10-CM | POA: Diagnosis not present

## 2023-03-29 DIAGNOSIS — F339 Major depressive disorder, recurrent, unspecified: Secondary | ICD-10-CM | POA: Diagnosis not present

## 2023-03-29 DIAGNOSIS — F419 Anxiety disorder, unspecified: Secondary | ICD-10-CM | POA: Diagnosis not present

## 2023-04-07 DIAGNOSIS — F419 Anxiety disorder, unspecified: Secondary | ICD-10-CM | POA: Diagnosis not present

## 2023-04-07 DIAGNOSIS — F909 Attention-deficit hyperactivity disorder, unspecified type: Secondary | ICD-10-CM | POA: Diagnosis not present

## 2023-04-07 DIAGNOSIS — F339 Major depressive disorder, recurrent, unspecified: Secondary | ICD-10-CM | POA: Diagnosis not present

## 2023-04-14 DIAGNOSIS — F419 Anxiety disorder, unspecified: Secondary | ICD-10-CM | POA: Diagnosis not present

## 2023-04-14 DIAGNOSIS — F339 Major depressive disorder, recurrent, unspecified: Secondary | ICD-10-CM | POA: Diagnosis not present

## 2023-04-14 DIAGNOSIS — F909 Attention-deficit hyperactivity disorder, unspecified type: Secondary | ICD-10-CM | POA: Diagnosis not present

## 2023-04-29 ENCOUNTER — Telehealth: Payer: Self-pay

## 2023-04-29 NOTE — Telephone Encounter (Signed)
 LVM to reschedule. Provider out of the office.

## 2023-05-05 DIAGNOSIS — F909 Attention-deficit hyperactivity disorder, unspecified type: Secondary | ICD-10-CM | POA: Diagnosis not present

## 2023-05-05 DIAGNOSIS — F419 Anxiety disorder, unspecified: Secondary | ICD-10-CM | POA: Diagnosis not present

## 2023-05-05 DIAGNOSIS — F339 Major depressive disorder, recurrent, unspecified: Secondary | ICD-10-CM | POA: Diagnosis not present

## 2023-05-17 DIAGNOSIS — M810 Age-related osteoporosis without current pathological fracture: Secondary | ICD-10-CM | POA: Diagnosis not present

## 2023-05-19 DIAGNOSIS — F419 Anxiety disorder, unspecified: Secondary | ICD-10-CM | POA: Diagnosis not present

## 2023-05-19 DIAGNOSIS — F909 Attention-deficit hyperactivity disorder, unspecified type: Secondary | ICD-10-CM | POA: Diagnosis not present

## 2023-05-19 DIAGNOSIS — F339 Major depressive disorder, recurrent, unspecified: Secondary | ICD-10-CM | POA: Diagnosis not present

## 2023-06-02 DIAGNOSIS — F339 Major depressive disorder, recurrent, unspecified: Secondary | ICD-10-CM | POA: Diagnosis not present

## 2023-06-02 DIAGNOSIS — F909 Attention-deficit hyperactivity disorder, unspecified type: Secondary | ICD-10-CM | POA: Diagnosis not present

## 2023-06-02 DIAGNOSIS — F419 Anxiety disorder, unspecified: Secondary | ICD-10-CM | POA: Diagnosis not present

## 2023-07-21 ENCOUNTER — Encounter (INDEPENDENT_AMBULATORY_CARE_PROVIDER_SITE_OTHER): Payer: Self-pay | Admitting: Otolaryngology

## 2023-08-06 DIAGNOSIS — Z0184 Encounter for antibody response examination: Secondary | ICD-10-CM | POA: Diagnosis not present

## 2023-08-06 DIAGNOSIS — E7800 Pure hypercholesterolemia, unspecified: Secondary | ICD-10-CM | POA: Diagnosis not present

## 2023-08-06 DIAGNOSIS — Z0000 Encounter for general adult medical examination without abnormal findings: Secondary | ICD-10-CM | POA: Diagnosis not present

## 2023-08-27 ENCOUNTER — Telehealth: Payer: Self-pay | Admitting: Cardiology

## 2023-08-27 NOTE — Telephone Encounter (Signed)
 Patient wants a provider switch from Dr. Albert Huff to Dr. Katheryne Pane.  Please confirm.

## 2023-09-01 NOTE — Telephone Encounter (Signed)
 LVM to sch an appt with Dr. Katheryne Pane

## 2023-09-10 ENCOUNTER — Ambulatory Visit (INDEPENDENT_AMBULATORY_CARE_PROVIDER_SITE_OTHER): Payer: Medicare HMO | Admitting: Otolaryngology

## 2023-09-27 ENCOUNTER — Ambulatory Visit: Payer: Self-pay | Admitting: Cardiology

## 2023-09-29 DIAGNOSIS — D529 Folate deficiency anemia, unspecified: Secondary | ICD-10-CM | POA: Diagnosis not present

## 2023-10-04 ENCOUNTER — Ambulatory Visit (INDEPENDENT_AMBULATORY_CARE_PROVIDER_SITE_OTHER): Admitting: Otolaryngology

## 2023-10-04 VITALS — BP 116/79 | HR 95

## 2023-10-04 DIAGNOSIS — R0981 Nasal congestion: Secondary | ICD-10-CM

## 2023-10-04 DIAGNOSIS — R0982 Postnasal drip: Secondary | ICD-10-CM

## 2023-10-04 DIAGNOSIS — J342 Deviated nasal septum: Secondary | ICD-10-CM

## 2023-10-04 DIAGNOSIS — J3089 Other allergic rhinitis: Secondary | ICD-10-CM

## 2023-10-04 DIAGNOSIS — J343 Hypertrophy of nasal turbinates: Secondary | ICD-10-CM

## 2023-10-04 MED ORDER — CETIRIZINE HCL 10 MG PO TABS: 10.0000 mg | ORAL_TABLET | Freq: Every day | ORAL | 11 refills | Status: AC

## 2023-10-04 MED ORDER — IPRATROPIUM BROMIDE 0.03 % NA SOLN: 2.0000 | Freq: Two times a day (BID) | NASAL | 12 refills | Status: AC

## 2023-10-04 NOTE — Progress Notes (Signed)
 ENT Progress Note:   Update 10/04/2023  Discussed the use of AI scribe software for clinical note transcription with the patient, who gave verbal consent to proceed.  History of Present Illness Melissa Carrillo is a 68 year old female who presents for follow-up of her nasal symptoms.  She has experienced significant improvement in her nasal symptoms with the use of Atrovent  nasal spray and Zyrtec . Atrovent  is particularly effective in reducing drainage, even when Zyrtec  is not taken. She describes her symptoms as 'a thousand percent better' though not completely resolved. She also uses steroid nasal rinses.   She identifies allergies to dust and mold and suspects allergies to outdoor allergens as well. She experiences postnasal drip, which is manageable with her current medication regimen. During the summer, her symptoms can worsen due to decreased fluid intake, leading to thicker secretions.   Records Reviewed:  Initial Evaluation  Update 03/12/2023   Discussed the use of AI scribe software for clinical note transcription with the patient, who gave verbal consent to proceed.  History of Present Illness   The patient, with a history of nasal congestion and postnasal drainage issues, reports an improvement in her symptoms. They recently experienced upper respiratory infection cold-like symptoms, but interestingly, did not have the usual postnasal drainage associated with such an illness. The patient is currently on a regimen of Zyrtec  and ipratropium bromide , and performs a nasal rinse with steroid once daily. They have noticed a positive difference since starting this regimen.  Prior nasal endoscopy with evidence of septal spur. They speculate that this may have been caused by an accident involving their son when he was a baby.   The primary issue for the patient is postnasal drainage, which they describe as changing colors from time to time. They express concern about potential  exposure to irritants or pathogens while working outside, which they enjoy. However, they do not report any cough unless they have a cold.  She had CT of the sinuses which demonstrated clear paranasal sinuses.  Initial evaluation 01/18/2023 Reason for Consult: chronic post-nasal drip   HPI: Melissa Carrillo is an 68 y.o. female with hx of long-standing environmental allergies and nasal congestion is here for chronic post-nasal drainage.  She uses Flonase and nasal saline rinses daily and rinses out yellow thick secretions. She drinks plenty of water to try and loosen the secretions. No hx of sinus and nasal surgery. She had allergy testing long time ago ~ 20 yrs ago, no allergy shots in the past. She thinks she had nasal trauma before (was told her nose looked like it was broken). She has multiple allergies based on testing in the past. She has had sinus infections in the past, never goes to see a physician. She has good sense of smell. No sinus imaging  Records Reviewed:  Cardiology Note 09/25/2022 Melissa Carrillo is a 68 y.o. Caucasian female whose past medical history and cardiovascular risk factors include: Hypertension, hyperlipidemia, minimal coronary artery calcification (3.9, 53rd percentile), aortic atherosclerosis, ADHD, PTSD.   Patient was referred to the practice for evaluation of coronary artery disease given her family history and risk factors.   She had a coronary calcium score in October 2023 which notes minimal CAC as well has aortic atherosclerosis.   She was under the care of my partner Dr. Sabina Custovic and I am seeing her for the first time.   Prior to establishing care with myself she is already undergone echo and stress test results  reviewed as part of medical decision making today.   She presents today for follow-up.  Denies anginal chest pain or heart failure symptoms.   Office note by PA Corean Thayne Geralds 10/13/21  68 y.o. female who presents with concern for  the possibility of Lyme disease after 2 tick bites The bites were earlier this summer, one to her right buttock one to the left posterior neck. The one on the buttock was a tick likely from [place] , while the one to her neck was likely from her back yard. Each tick was engorged when pulled off, the one to her neck moreso. There was a small rash around the one to her buttock that resolved and she denies any subsequent rash or skin discoloration. She does feel like the spot to the back of her neck won't heal. Has been using rubbing alcohol to it.  She is feeling well. Denies headaches, confusion, balance loss, joint swelling.  She would like serology for peace of mind, as she was rx doxycycline 6 weeks ago but could not tolerate more than 3 days of the medication due to severe GI side effects.    Past Medical History:  Diagnosis Date   Depression    Hyperlipidemia     Past Surgical History:  Procedure Laterality Date   CESAREAN SECTION     REDUCTION MAMMAPLASTY      Family History  Problem Relation Age of Onset   Stroke Mother    Pancreatitis Mother    Stroke Father    Kidney failure Father    Syncope episode Sister    Heart attack Brother 65   Gout Brother    Seizures Brother    Pancreatic cancer Maternal Aunt    Heart attack Maternal Grandmother    Thyroid cancer Daughter     Social History:  reports that she quit smoking about 9 years ago. Her smoking use included cigarettes. She does not have any smokeless tobacco history on file. She reports current alcohol use of about 5.0 standard drinks of alcohol per week. She reports that she does not use drugs.  Allergies:  Allergies  Allergen Reactions   Doxycycline     Other Reaction(s): GI Intolerance   Bacitracin-Polymyxin B Rash   Neomycin-Polymyxin-Gramicidin Rash    It is an over the counter item   Nickel Rash   Other Rash    It is an over the counter item    Medications: I have reviewed the patient's current  medications.  The PMH, PSH, Medications, Allergies, and SH were reviewed and updated.  ROS: Constitutional: Negative for fever, weight loss and weight gain. Cardiovascular: Negative for chest pain and dyspnea on exertion. Respiratory: Is not experiencing shortness of breath at rest. Gastrointestinal: Negative for nausea and vomiting. Neurological: Negative for headaches. Psychiatric: The patient is not nervous/anxious  Blood pressure 116/79, pulse 95, SpO2 94%.  PHYSICAL EXAM:  Exam: General: Well-developed, well-nourished Respiratory Respiratory effort: Equal inspiration and expiration without stridor Cardiovascular Peripheral Vascular: Warm extremities with equal color/perfusion Eyes: No nystagmus with equal extraocular motion bilaterally Neuro/Psych/Balance: Patient oriented to person, place, and time; Appropriate mood and affect; Gait is intact with no imbalance; Cranial nerves I-XII are intact Head and Face Inspection: Normocephalic and atraumatic without mass or lesion Palpation: Facial skeleton intact without bony stepoffs Salivary Glands: No mass or tenderness Facial Strength: Facial motility symmetric and full bilaterally ENT Pinna: External ear intact and fully developed External canal: Canal is patent with intact skin Tympanic Membrane: Clear and  mobile External Nose: No scar or anatomic deformity Lips, Teeth, and gums: Mucosa and teeth intact and viable Oral cavity/oropharynx: No erythema or exudate, no lesions present Neck Neck and Trachea: Midline trachea without mass or lesion Thyroid: No mass or nodularity Lymphatics: No lymphadenopathy  CT sinuses 03/09/23 FINDINGS: Paranasal sinuses:   Frontal: Normally aerated. Patent frontal sinus drainage pathways.   Ethmoid: Normally aerated.   Maxillary: Trace bilateral maxillary floor mucosal thickening.   Sphenoid: Normally aerated. Patent sphenoethmoidal recesses.   Right ostiomeatal unit: Patent.    Left ostiomeatal unit: Patent.   Nasal passages: Patent. Moderate rightward deviation of the nasal septum. Rightward projecting osseous spur without turbinate contact.   Other: Orbits and intracranial compartment are unremarkable. Visible mastoid air cells are normally aerated.   IMPRESSION: 1. Trace bilateral maxillary floor mucosal thickening. 2. Patent sinus drainage pathways. 3. Moderate rightward deviation of the nasal septum  Assessment/Plan: Encounter Diagnoses  Name Primary?   Chronic nasal congestion Yes   Environmental and seasonal allergies    Post-nasal drip    Hypertrophy of both inferior nasal turbinates    Nasal septal deviation       Chronic nasal congestion and post-nasal drainage with yellow mucus when doing nasal saline rinses, already on Flonase without significant relief, hx of (+) allergy testing in the past no sinus surgery or allergy shots - exam including nasal endoscopy with right sided NSD and ITH, edema of nasal mucosa but no purulence or polyps - CT sinuses to evaluate for chronic sinus inflammation  - stop Flonase, start nasal steroid rinses with mometasone - Rx sent today  - start Zyrtec  10 mg daily  - start Ipratropium Bromide    2. Environmental allergies  - medical management as above  - will consider allergy testing in the future   3. Septal deviation and ITH  - medical management as above, will consider surgical options if fails medical management   Update 03/12/2023  Chronic nasal congestion and post-nasal drainage with yellow mucus when doing nasal saline rinses, already on Flonase without significant relief, hx of (+) allergy testing in the past no sinus surgery or allergy shots.  Her initial evaluation demonstrated rightward septal deviation inferior turban hypertrophy but no polyps or pus.  Since initial evaluation she initiated nasal rinses with steroid and saline solution and appears to be doing much better on this regimen from the  symptom standpoint. - CT sinuses demonstrated clear paranasal sinuses without evidence of chronic sinusitis -we reviewed her imaging study results today -Continue nasal steroid rinses with mometasone  -Continue Zyrtec  10 mg daily  -Continue Ipratropium Bromide  2 puffs bilateral nares   2. Environmental allergies.  History of positive allergy testing reported by patient - medical management as above  - will consider allergy re-testing in the future if she elects to consider allergy shots  3. Septal deviation to the right and ITH  - medical management as above, will consider surgical options if fails medical management  Update 10/04/2023 Assessment and Plan Assessment & Plan Chronic nasal congestion  Significant improvement on Zyrtec  and Atrovent  nasal spray with steroid nasal rinses.  - Continue Zyrtec  and Atrovent  nasal spray. - Refills sent - continue steroid nasal rinses  - RTC as needed and call for refills      Elena Larry, MD Otolaryngology [facility] Phone: [phone] Fax: [phone]    10/04/2023, 1:49 PM

## 2023-10-11 DIAGNOSIS — F3181 Bipolar II disorder: Secondary | ICD-10-CM | POA: Diagnosis not present

## 2023-10-19 ENCOUNTER — Encounter: Payer: Self-pay | Admitting: Cardiovascular Disease

## 2023-10-19 ENCOUNTER — Ambulatory Visit: Attending: Cardiovascular Disease | Admitting: Cardiovascular Disease

## 2023-10-19 VITALS — BP 122/72 | HR 82 | Ht 64.0 in | Wt 148.8 lb

## 2023-10-19 DIAGNOSIS — R931 Abnormal findings on diagnostic imaging of heart and coronary circulation: Secondary | ICD-10-CM | POA: Diagnosis not present

## 2023-10-19 DIAGNOSIS — I10 Essential (primary) hypertension: Secondary | ICD-10-CM

## 2023-10-19 DIAGNOSIS — Z8249 Family history of ischemic heart disease and other diseases of the circulatory system: Secondary | ICD-10-CM

## 2023-10-19 DIAGNOSIS — E782 Mixed hyperlipidemia: Secondary | ICD-10-CM | POA: Diagnosis not present

## 2023-10-19 DIAGNOSIS — Z720 Tobacco use: Secondary | ICD-10-CM | POA: Diagnosis not present

## 2023-10-19 NOTE — Assessment & Plan Note (Signed)
 20 to 30 pack years tobacco abuse having quit in 2015.

## 2023-10-19 NOTE — Assessment & Plan Note (Signed)
 History of essential hypertension blood pressure measured today 122/72.  She is on lisinopril .

## 2023-10-19 NOTE — Assessment & Plan Note (Signed)
 Brother had a myocardial infarction

## 2023-10-19 NOTE — Assessment & Plan Note (Signed)
 Coronary calcium score was 3.94 all in the circumflex measured on 01/15/2022.  She is asymptomatic.

## 2023-10-19 NOTE — Patient Instructions (Signed)
 Medication Instructions:  Your physician recommends that you continue on your current medications as directed. Please refer to the Current Medication list given to you today.  *If you need a refill on your cardiac medications before your next appointment, please call your pharmacy*   Lab Work: Your physician recommends that you return for lab work in: the next week or 2 for FASTING lipid/liver panel  If you have labs (blood work) drawn today and your tests are completely normal, you will receive your results only by: MyChart Message (if you have MyChart) OR A paper copy in the mail If you have any lab test that is abnormal or we need to change your treatment, we will call you to review the results.   Follow-Up: At [facility], you and your health needs are our priority.  As part of our continuing mission to provide you with exceptional heart care, our providers are all part of one team.  This team includes your primary Cardiologist (physician) and Advanced Practice Providers or APPs (Physician Assistants and Nurse Practitioners) who all work together to provide you with the care you need, when you need it.  Your next appointment:   12 month(s)  Provider:   Lauro Portal, MD    We recommend signing up for the patient portal called MyChart.  Sign up information is provided on this After Visit Summary.  MyChart is used to connect with patients for Virtual Visits (Telemedicine).  Patients are able to view lab/test results, encounter notes, upcoming appointments, etc.  Non-urgent messages can be sent to your provider as well.   To learn more about what you can do with MyChart, go to [email or web address].

## 2023-10-19 NOTE — Progress Notes (Signed)
 10/19/2023 Melissa Carrillo   01/22/1956  [id number]  Primary Physician Alben Therisa MATSU, PA Primary Cardiologist: Dorn JINNY Lesches MD [facility] [place] [place], [place]  HPI:  Melissa Carrillo is a 68 y.o. thin-appearing married Caucasian female mother of 4, grandmother of 6 grandchildren whose husband Melissa Carrillo is also a patient of mine.  She is transferring her care from Dr. Michele up to myself.  Her risk factors include 20 to 30 pack years tobacco abuse having quit in 2015, treated hypertension and hyperlipidemia.  Her brother apparently had a heart attack.  She is never had a heart attack or stroke.  She denies chest pain or shortness of breath.  She walks on works out in her garden.  She did have a coronary calcium score performed 01/15/2022 which was 3.94 all in the circumflex territory.  Subsequent Myoview  stress test was low risk and nonischemic and a 2D echo was normal.   Current Meds  Medication Sig   amphetamine-dextroamphetamine (ADDERALL XR) 30 MG 24 hr capsule Take 30 mg by mouth daily.   amphetamine-dextroamphetamine (ADDERALL) 20 MG tablet Take 20 mg by mouth daily as needed.   aspirin EC 81 MG tablet Take 81 mg by mouth daily. Swallow whole.   atorvastatin (LIPITOR) 40 MG tablet Take 40 mg by mouth daily.   Biotin 1 MG CAPS Take by mouth.   cetirizine  (ZYRTEC ) 10 MG tablet Take 1 tablet (10 mg total) by mouth daily.   clonazePAM (KLONOPIN) 1 MG tablet Take 1 mg by mouth 2 (two) times daily as needed.   denosumab  (PROLIA ) 60 MG/ML SOSY injection Inject 60 mg into the skin every 6 (six) months.   escitalopram (LEXAPRO) 20 MG tablet Take 20 mg by mouth daily.   ezetimibe (ZETIA) 10 MG tablet Take 10 mg by mouth daily.   ipratropium (ATROVENT ) 0.03 % nasal spray Place 2 sprays into both nostrils every 12 (twelve) hours.   lamoTRIgine (LAMICTAL) 200 MG tablet Take 200 mg by mouth daily.   lisinopril  (ZESTRIL ) 10 MG tablet Take 1 tablet (10 mg total) by mouth daily.   multivitamin-lutein  (OCUVITE-LUTEIN) CAPS capsule Take 1 capsule by mouth daily.     Allergies  Allergen Reactions   Doxycycline     Other Reaction(s): GI Intolerance   Bacitracin-Polymyxin B Rash   Neomycin-Polymyxin-Gramicidin Rash    It is an over the counter item   Nickel Rash   Other Rash    It is an over the counter item    Social History   Socioeconomic History   Marital status: Married    Spouse name: Melissa Carrillo   Number of children: 4   Years of education: Not on file   Highest education level: Not on file  Occupational History   Not on file  Tobacco Use   Smoking status: Former    Current packs/day: 0.00    Types: Cigarettes    Quit date: 2016    Years since quitting: 9.5   Smokeless tobacco: Not on file  Substance and Sexual Activity   Alcohol use: Yes    Alcohol/week: 5.0 standard drinks of alcohol    Types: 5 Shots of liquor per week    Comment: about 5 vodka drinks a week   Drug use: No   Sexual activity: Yes  Other Topics Concern   Not on file  Social History Narrative   Not on file   Social Drivers of Health   Financial Resource Strain: Not on file  Food Insecurity: Not on file  Transportation Needs: Not on file  Physical Activity: Not on file  Stress: Not on file  Social Connections: Not on file  Intimate Partner Violence: Not on file     Review of Systems: General: negative for chills, fever, night sweats or weight changes.  Cardiovascular: negative for chest pain, dyspnea on exertion, edema, orthopnea, palpitations, paroxysmal nocturnal dyspnea or shortness of breath Dermatological: negative for rash Respiratory: negative for cough or wheezing Urologic: negative for hematuria Abdominal: negative for nausea, vomiting, diarrhea, bright red blood per rectum, melena, or hematemesis Neurologic: negative for visual changes, syncope, or dizziness All other systems reviewed and are otherwise negative except as noted above.    Blood pressure 122/72, pulse 82, height  5' 4 (1.626 m), weight 148 lb 12.8 oz (67.5 kg), SpO2 96%.  General appearance: alert and no distress Neck: no adenopathy, no carotid bruit, no JVD, supple, symmetrical, trachea midline, and thyroid not enlarged, symmetric, no tenderness/mass/nodules Lungs: clear to auscultation bilaterally Heart: regular rate and rhythm, S1, S2 normal, no murmur, click, rub or gallop Extremities: extremities normal, atraumatic, no cyanosis or edema Pulses: 2+ and symmetric Skin: Skin color, texture, turgor normal. No rashes or lesions Neurologic: Grossly normal  EKG EKG Interpretation Date/Time:  Tuesday October 19 2023 14:34:48 EDT Ventricular Rate:  82 PR Interval:  160 QRS Duration:  96 QT Interval:  370 QTC Calculation: 432 R Axis:   42  Text Interpretation: Normal sinus rhythm Nonspecific ST and T wave abnormality No previous ECGs available Confirmed by Court Carrier [phone]) on 10/19/2023 2:53:51 PM    ASSESSMENT AND PLAN:   Mixed hyperlipidemia Hyperlipidemia on statin therapy and possibly Zetia with lipid profile performed 08/06/2023 revealing total cholesterol of 197, LDL 126 and HDL of 57.  I am going to recheck a lipid and liver profile.  Essential hypertension History of essential hypertension blood pressure measured today 122/72.  She is on lisinopril .  Family history of heart disease Brother had a myocardial infarction  Tobacco abuse 20 to 30 pack years tobacco abuse having quit in 2015.  Elevated coronary artery calcium score Coronary calcium score was 3.94 all in the circumflex measured on 01/15/2022.  She is asymptomatic.     Carrier DOROTHA Court MD FACP,FACC,FAHA, [facility] 10/19/2023 3:06 PM

## 2023-10-19 NOTE — Assessment & Plan Note (Signed)
 Hyperlipidemia on statin therapy and possibly Zetia with lipid profile performed 08/06/2023 revealing total cholesterol of 197, LDL 126 and HDL of 57.  I am going to recheck a lipid and liver profile.

## 2023-11-05 ENCOUNTER — Other Ambulatory Visit (HOSPITAL_BASED_OUTPATIENT_CLINIC_OR_DEPARTMENT_OTHER): Payer: Self-pay | Admitting: Family Medicine

## 2023-11-05 ENCOUNTER — Encounter (HOSPITAL_BASED_OUTPATIENT_CLINIC_OR_DEPARTMENT_OTHER): Payer: Self-pay | Admitting: Family Medicine

## 2023-11-05 DIAGNOSIS — M810 Age-related osteoporosis without current pathological fracture: Secondary | ICD-10-CM

## 2023-11-05 DIAGNOSIS — E2839 Other primary ovarian failure: Secondary | ICD-10-CM

## 2023-11-15 DIAGNOSIS — M810 Age-related osteoporosis without current pathological fracture: Secondary | ICD-10-CM | POA: Diagnosis not present

## 2023-12-14 ENCOUNTER — Ambulatory Visit (HOSPITAL_BASED_OUTPATIENT_CLINIC_OR_DEPARTMENT_OTHER)
Admission: RE | Admit: 2023-12-14 | Discharge: 2023-12-14 | Disposition: A | Discharge status: Home / Self Care | Source: Ambulatory Visit | Attending: Family Medicine | Admitting: Family Medicine

## 2023-12-14 DIAGNOSIS — Z780 Asymptomatic menopausal state: Secondary | ICD-10-CM | POA: Diagnosis not present

## 2023-12-14 DIAGNOSIS — E2839 Other primary ovarian failure: Secondary | ICD-10-CM | POA: Diagnosis not present

## 2023-12-14 DIAGNOSIS — M810 Age-related osteoporosis without current pathological fracture: Secondary | ICD-10-CM | POA: Diagnosis not present
